# Patient Record
Sex: Female | Born: 1970 | Race: Black or African American | Hispanic: No | Marital: Single | Smoking: Never smoker
Health system: Southern US, Community
[De-identification: ages and names within clinical notes are randomized; demographics above are authoritative.]

## PROBLEM LIST (undated history)

## (undated) ENCOUNTER — Inpatient Hospital Stay (HOSPITAL_COMMUNITY): Payer: Self-pay

## (undated) DIAGNOSIS — Z789 Other specified health status: Secondary | ICD-10-CM

## (undated) DIAGNOSIS — R7303 Prediabetes: Secondary | ICD-10-CM

## (undated) HISTORY — PX: APPENDECTOMY: SHX54

## (undated) HISTORY — PX: ABDOMINAL HYSTERECTOMY: SHX81

## (undated) HISTORY — PX: NO PAST SURGERIES: SHX2092

---

## 2009-02-27 ENCOUNTER — Inpatient Hospital Stay (HOSPITAL_COMMUNITY): Admission: AD | Admit: 2009-02-27 | Discharge: 2009-02-27 | Payer: Self-pay | Admitting: Obstetrics and Gynecology

## 2009-03-12 ENCOUNTER — Inpatient Hospital Stay (HOSPITAL_COMMUNITY): Admission: AD | Admit: 2009-03-12 | Discharge: 2009-03-12 | Payer: Self-pay | Admitting: Obstetrics & Gynecology

## 2009-03-18 ENCOUNTER — Encounter: Payer: Self-pay | Admitting: Obstetrics & Gynecology

## 2009-03-18 ENCOUNTER — Ambulatory Visit: Payer: Self-pay | Admitting: Obstetrics and Gynecology

## 2009-03-18 LAB — CONVERTED CEMR LAB
Basophils Relative: 0 % (ref 0–1)
Eosinophils Absolute: 0.4 10*3/uL (ref 0.0–0.7)
Eosinophils Relative: 6 % — ABNORMAL HIGH (ref 0–5)
HCT: 39.5 % (ref 36.0–46.0)
Hemoglobin: 14 g/dL (ref 12.0–15.0)
Hgb A: 97.3 % (ref 96.8–97.8)
Hgb F Quant: 0 % (ref 0.0–2.0)
Lymphocytes Relative: 31 % (ref 12–46)
Lymphs Abs: 1.8 10*3/uL (ref 0.7–4.0)
MCV: 91.2 fL (ref 78.0–100.0)
Monocytes Absolute: 0.4 10*3/uL (ref 0.1–1.0)
Neutro Abs: 3.3 10*3/uL (ref 1.7–7.7)
Platelets: 277 10*3/uL (ref 150–400)
RBC: 4.33 M/uL (ref 3.87–5.11)
RDW: 13.2 % (ref 11.5–15.5)

## 2009-03-19 ENCOUNTER — Ambulatory Visit (HOSPITAL_COMMUNITY): Admission: RE | Admit: 2009-03-19 | Discharge: 2009-03-19 | Payer: Self-pay | Admitting: Obstetrics & Gynecology

## 2009-04-17 ENCOUNTER — Ambulatory Visit (HOSPITAL_COMMUNITY): Admission: RE | Admit: 2009-04-17 | Discharge: 2009-04-17 | Payer: Self-pay | Admitting: Obstetrics

## 2009-05-08 ENCOUNTER — Ambulatory Visit (HOSPITAL_COMMUNITY): Admission: RE | Admit: 2009-05-08 | Discharge: 2009-05-08 | Payer: Self-pay | Admitting: Obstetrics

## 2009-08-21 ENCOUNTER — Ambulatory Visit (HOSPITAL_COMMUNITY): Admission: RE | Admit: 2009-08-21 | Discharge: 2009-08-21 | Payer: Self-pay | Admitting: Obstetrics

## 2009-10-02 ENCOUNTER — Inpatient Hospital Stay (HOSPITAL_COMMUNITY): Admission: AD | Admit: 2009-10-02 | Discharge: 2009-10-04 | Payer: Self-pay | Admitting: Obstetrics

## 2010-03-27 LAB — CBC
HCT: 27.7 % — ABNORMAL LOW (ref 36.0–46.0)
Hemoglobin: 11.1 g/dL — ABNORMAL LOW (ref 12.0–15.0)
Hemoglobin: 9.5 g/dL — ABNORMAL LOW (ref 12.0–15.0)
MCH: 29.5 pg (ref 26.0–34.0)
MCH: 30.1 pg (ref 26.0–34.0)
MCHC: 33.9 g/dL (ref 30.0–36.0)
MCV: 86.9 fL (ref 78.0–100.0)
Platelets: 259 10*3/uL (ref 150–400)
WBC: 16.5 10*3/uL — ABNORMAL HIGH (ref 4.0–10.5)
WBC: 6.2 10*3/uL (ref 4.0–10.5)

## 2010-03-27 LAB — RPR: RPR Ser Ql: NONREACTIVE

## 2010-04-03 LAB — URINALYSIS, ROUTINE W REFLEX MICROSCOPIC
Glucose, UA: NEGATIVE mg/dL
Protein, ur: NEGATIVE mg/dL
Urobilinogen, UA: 0.2 mg/dL (ref 0.0–1.0)

## 2010-04-07 LAB — URINALYSIS, ROUTINE W REFLEX MICROSCOPIC
Bilirubin Urine: NEGATIVE
Hgb urine dipstick: NEGATIVE
Ketones, ur: 15 mg/dL — AB
pH: 6.5 (ref 5.0–8.0)

## 2010-06-24 ENCOUNTER — Emergency Department (HOSPITAL_COMMUNITY)
Admission: EM | Admit: 2010-06-24 | Discharge: 2010-06-25 | Disposition: A | Discharge status: Home / Self Care | Payer: Worker's Compensation | Attending: Emergency Medicine | Admitting: Emergency Medicine

## 2010-06-24 DIAGNOSIS — M545 Low back pain, unspecified: Secondary | ICD-10-CM | POA: Insufficient documentation

## 2010-06-24 DIAGNOSIS — S335XXA Sprain of ligaments of lumbar spine, initial encounter: Secondary | ICD-10-CM | POA: Insufficient documentation

## 2010-06-24 DIAGNOSIS — S139XXA Sprain of joints and ligaments of unspecified parts of neck, initial encounter: Secondary | ICD-10-CM | POA: Insufficient documentation

## 2010-06-24 DIAGNOSIS — R51 Headache: Secondary | ICD-10-CM | POA: Insufficient documentation

## 2010-06-24 DIAGNOSIS — M542 Cervicalgia: Secondary | ICD-10-CM | POA: Insufficient documentation

## 2010-06-25 ENCOUNTER — Emergency Department (HOSPITAL_COMMUNITY): Payer: Worker's Compensation

## 2011-02-26 IMAGING — US US OB COMP LESS 14 WK
1 series · 14 of 21 positions shown · non-contrast
Comparison: none

OBSTETRICAL ULTRASOUND:
 This ultrasound exam was performed in the [HOSPITAL] Ultrasound Department.  The OB US report was generated in the AS system, and faxed to the ordering physician.  This report is also available in [HOSPITAL]?s AccessANYware and in [REDACTED] PACS.

[Series 1: us ob comp less 14 wks · 14 of 21 slices shown]
[im 1/21]
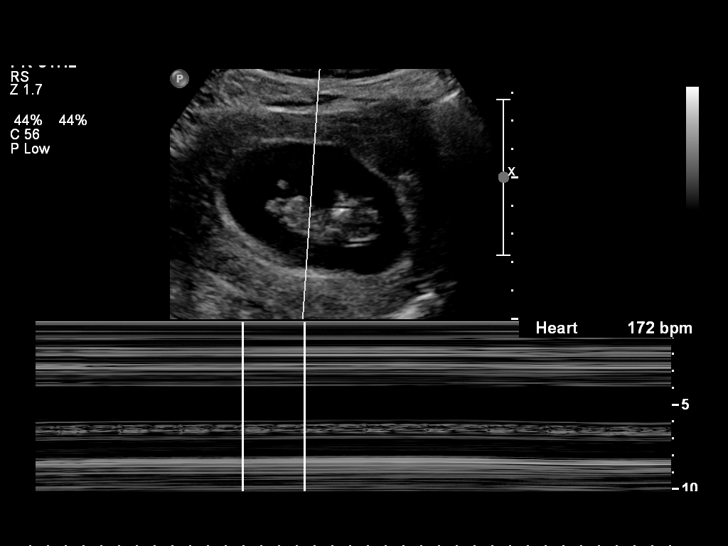
[im 3/21]
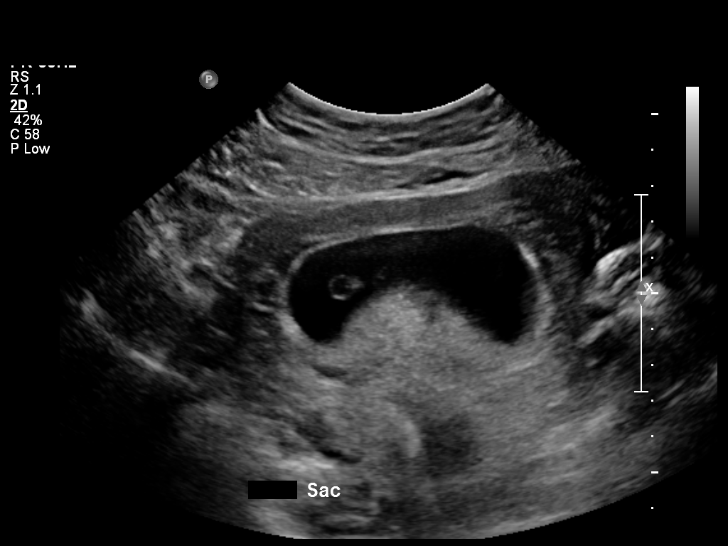
[im 4/21]
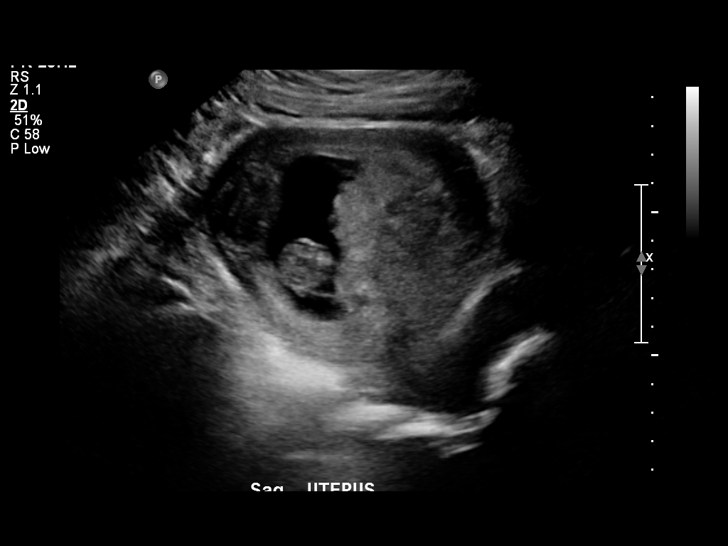
[im 6/21]
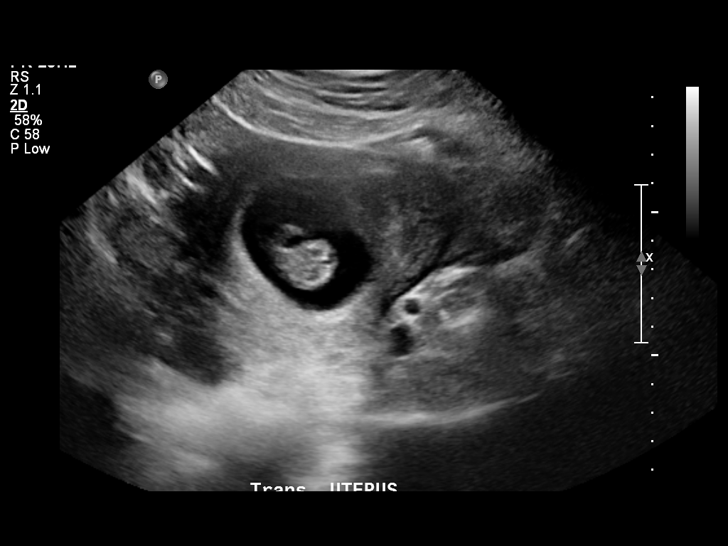
[im 7/21]
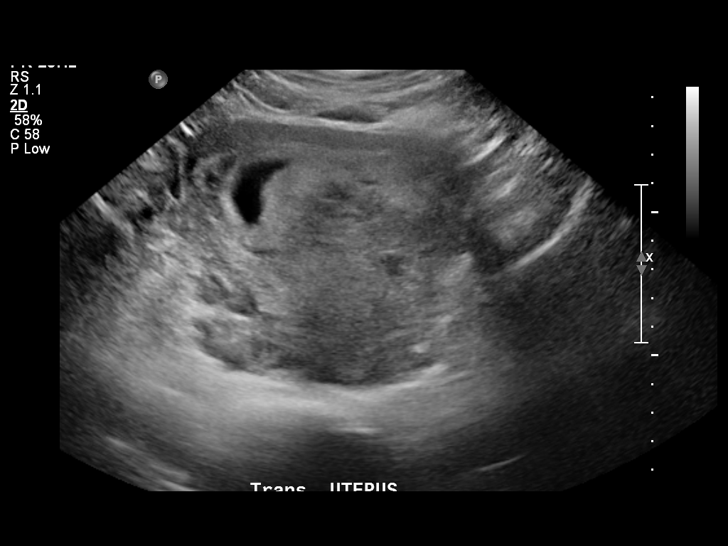
[im 9/21]
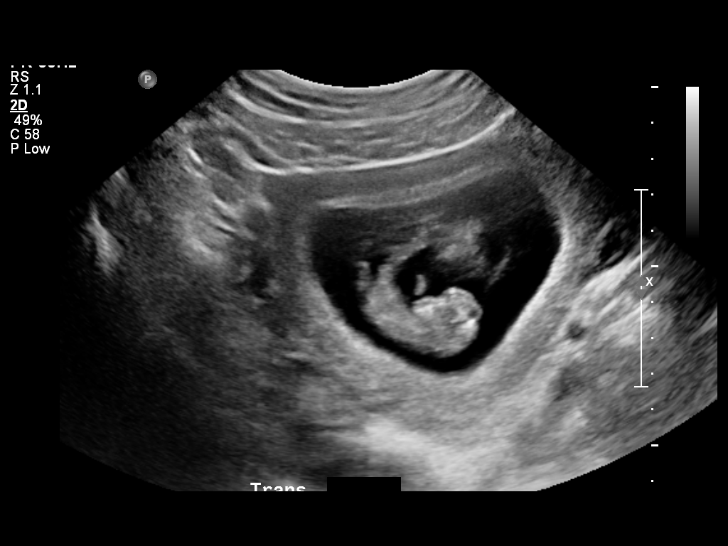
[im 10/21]
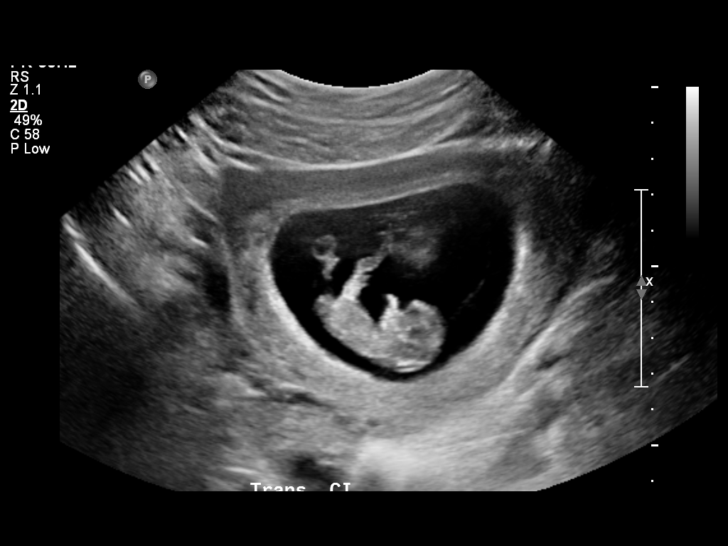
[im 12/21]
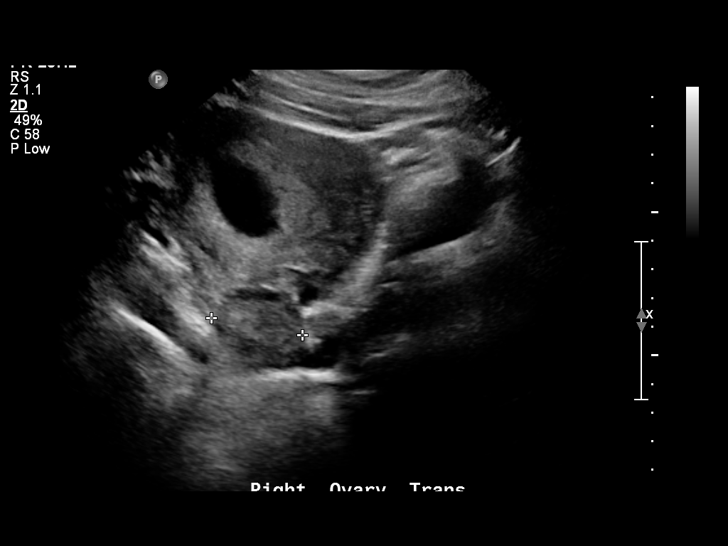
[im 13/21]
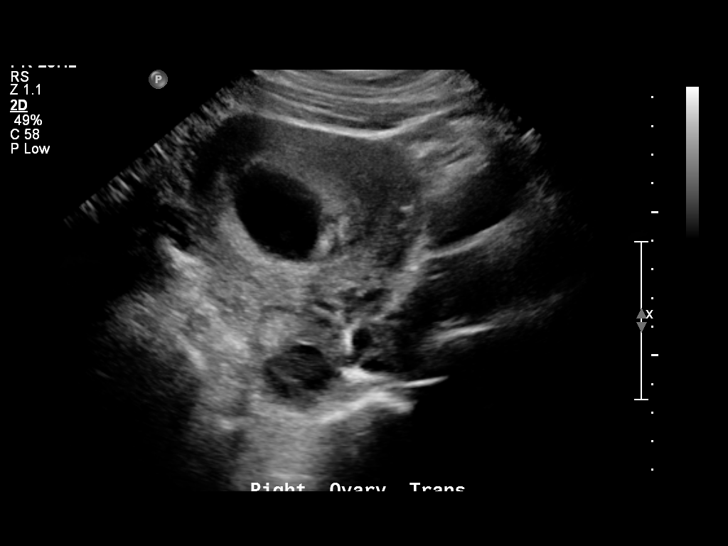
[im 15/21]
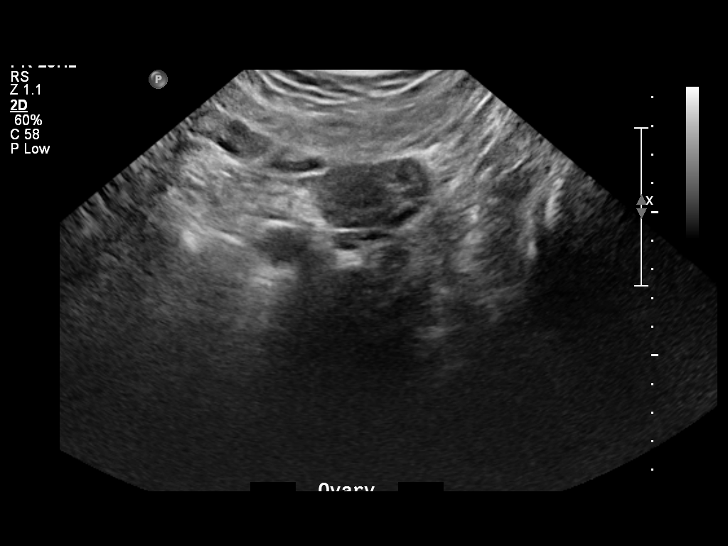
[im 16/21]
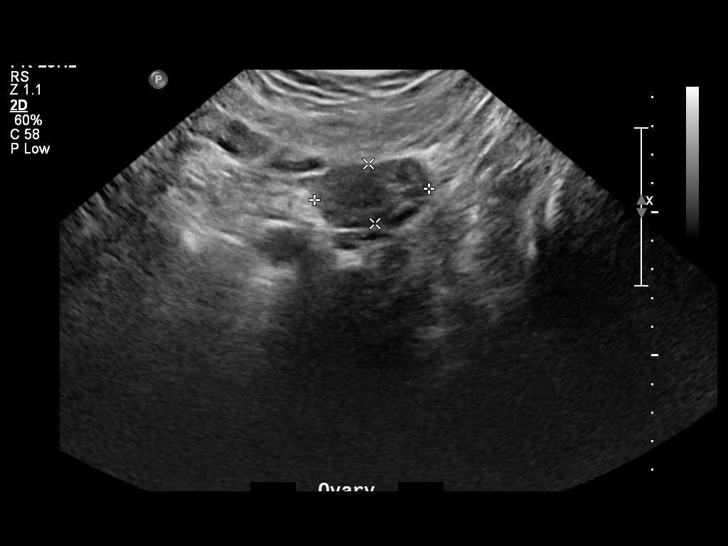
[im 18/21]
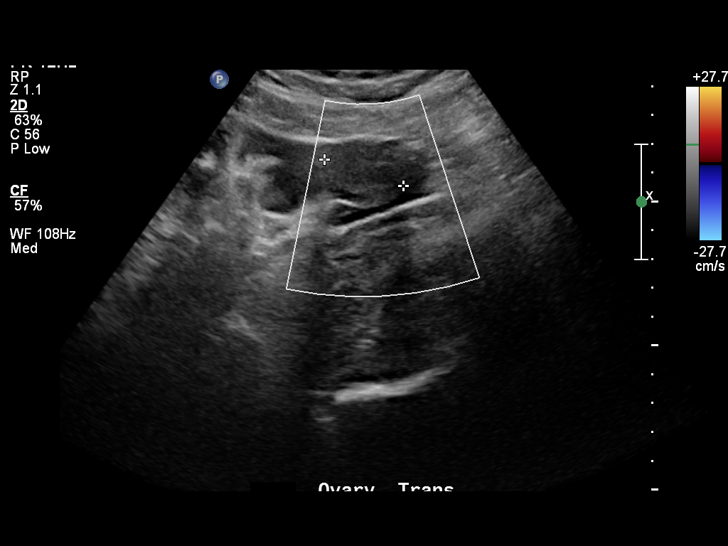
[im 19/21]
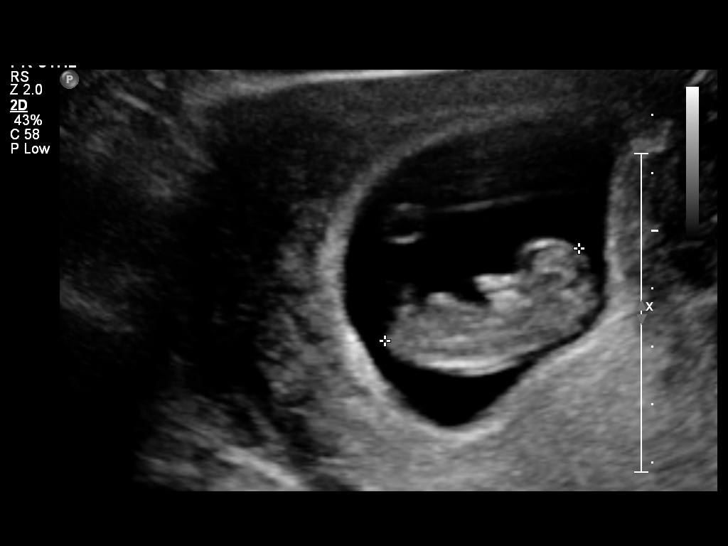
[im 21/21]
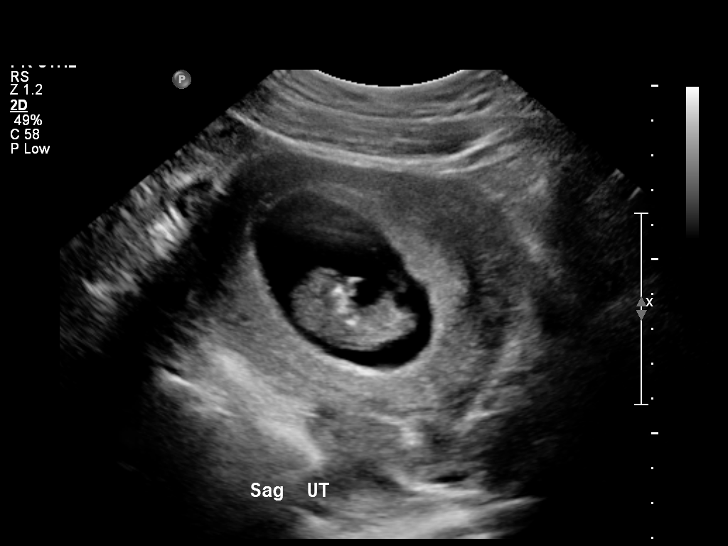

[14 of 21 positions shown; findings below may reference images not displayed]

IMPRESSION: See AS Obstetric US report.

## 2012-11-23 ENCOUNTER — Encounter (HOSPITAL_COMMUNITY): Payer: Self-pay | Admitting: Emergency Medicine

## 2012-11-23 ENCOUNTER — Emergency Department (HOSPITAL_COMMUNITY)
Admission: EM | Admit: 2012-11-23 | Discharge: 2012-11-23 | Discharge status: Against Medical Advice | Payer: Self-pay | Attending: Emergency Medicine | Admitting: Emergency Medicine

## 2012-11-23 DIAGNOSIS — S0990XA Unspecified injury of head, initial encounter: Secondary | ICD-10-CM | POA: Insufficient documentation

## 2012-11-23 DIAGNOSIS — Y92410 Unspecified street and highway as the place of occurrence of the external cause: Secondary | ICD-10-CM | POA: Insufficient documentation

## 2012-11-23 DIAGNOSIS — Y9389 Activity, other specified: Secondary | ICD-10-CM | POA: Insufficient documentation

## 2012-11-23 NOTE — ED Notes (Signed)
Pt states she has decided she would rather go see her PCP.  Pt leaving After Triage.
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
## 2015-04-17 ENCOUNTER — Ambulatory Visit: Payer: Medicaid Other | Admitting: Certified Nurse Midwife

## 2015-08-30 ENCOUNTER — Encounter (HOSPITAL_COMMUNITY): Payer: Self-pay | Admitting: *Deleted

## 2015-08-30 ENCOUNTER — Inpatient Hospital Stay (HOSPITAL_COMMUNITY): Payer: BLUE CROSS/BLUE SHIELD

## 2015-08-30 ENCOUNTER — Inpatient Hospital Stay (HOSPITAL_COMMUNITY)
Admission: AD | Admit: 2015-08-30 | Discharge: 2015-08-30 | Disposition: A | Discharge status: Home / Self Care | Payer: BLUE CROSS/BLUE SHIELD | Source: Ambulatory Visit | Attending: Obstetrics and Gynecology | Admitting: Obstetrics and Gynecology

## 2015-08-30 DIAGNOSIS — Z79899 Other long term (current) drug therapy: Secondary | ICD-10-CM | POA: Insufficient documentation

## 2015-08-30 DIAGNOSIS — O26892 Other specified pregnancy related conditions, second trimester: Secondary | ICD-10-CM | POA: Insufficient documentation

## 2015-08-30 DIAGNOSIS — O9989 Other specified diseases and conditions complicating pregnancy, childbirth and the puerperium: Secondary | ICD-10-CM

## 2015-08-30 DIAGNOSIS — R109 Unspecified abdominal pain: Secondary | ICD-10-CM | POA: Diagnosis not present

## 2015-08-30 DIAGNOSIS — O26891 Other specified pregnancy related conditions, first trimester: Secondary | ICD-10-CM | POA: Insufficient documentation

## 2015-08-30 DIAGNOSIS — O26899 Other specified pregnancy related conditions, unspecified trimester: Secondary | ICD-10-CM

## 2015-08-30 HISTORY — DX: Other specified health status: Z78.9

## 2015-08-30 LAB — CBC
HCT: 30.5 % — ABNORMAL LOW (ref 36.0–46.0)
HEMOGLOBIN: 10.2 g/dL — AB (ref 12.0–15.0)
MCH: 27.6 pg (ref 26.0–34.0)
MCHC: 33.4 g/dL (ref 30.0–36.0)
MCV: 82.7 fL (ref 78.0–100.0)
PLATELETS: 333 10*3/uL (ref 150–400)
RBC: 3.69 MIL/uL — ABNORMAL LOW (ref 3.87–5.11)
RDW: 16.2 % — ABNORMAL HIGH (ref 11.5–15.5)
WBC: 5.3 10*3/uL (ref 4.0–10.5)

## 2015-08-30 LAB — URINALYSIS, ROUTINE W REFLEX MICROSCOPIC
BILIRUBIN URINE: NEGATIVE
GLUCOSE, UA: NEGATIVE mg/dL
Hgb urine dipstick: NEGATIVE
KETONES UR: 15 mg/dL — AB
LEUKOCYTES UA: NEGATIVE
Nitrite: NEGATIVE
PH: 6 (ref 5.0–8.0)
Protein, ur: NEGATIVE mg/dL
Specific Gravity, Urine: 1.02 (ref 1.005–1.030)

## 2015-08-30 LAB — HCG, QUANTITATIVE, PREGNANCY: hCG, Beta Chain, Quant, S: 32 m[IU]/mL — ABNORMAL HIGH (ref <5)

## 2015-08-30 LAB — WET PREP, GENITAL
CLUE CELLS WET PREP: NONE SEEN
SPERM: NONE SEEN
Trich, Wet Prep: NONE SEEN
YEAST WET PREP: NONE SEEN

## 2015-08-30 LAB — ABO/RH: ABO/RH(D): O POS

## 2015-08-30 LAB — POCT PREGNANCY, URINE: Preg Test, Ur: POSITIVE — AB

## 2015-08-30 NOTE — MAU Note (Signed)
Pt states she had her period starting on 08-17-15 x 5 days.  Pt states she had extreme abd pain with cycle "like contractions", not her normal cramping.  Was passing lemon sized clots.  Pt states she still doesn't feel right.  She thinks she may have miscarried.

## 2015-08-30 NOTE — MAU Provider Note (Signed)
History     CSN: [id number]  Arrival date and time: 08/30/15 1605   First Provider Initiated Contact with Patient 08/30/15 1642      Chief Complaint  Patient presents with  . Abdominal Pain   HPI  RN note: Pt states she had her period starting on 08-17-15 x 5 days.  Pt states she had extreme abd pain with cycle "like contractions", not her normal cramping.  Was passing lemon sized clots.  Pt states she still doesn't feel right.  She thinks she may have miscarried.  Pt is 45 y.o.G3P0 789w6d who presents with positive pregnancy test and abdominal cramping and bleeding. Pt saw PCP  With [facility] and had positive UPT.  Pt states she had her period on 08/17/2015 through 08/22/2015 with heavy bleeding and clots along with cramps that felt like a ctx.   Pt is no longer bleeding but thinks she may have had a miscarriage. Pt was not using anything for contraception. Pt denies otherwise vaginal discharge  Past Medical History:  Diagnosis Date  . Medical history non-contributory     Past Surgical History:  Procedure Laterality Date  . NO PAST SURGERIES      History reviewed. No pertinent family history.  Social History  Substance Use Topics  . Smoking status: Never Smoker  . Smokeless tobacco: Never Used  . Alcohol use Yes     Comment: socially    Allergies: No Known Allergies  Prescriptions Prior to Admission  Medication Sig Dispense Refill Last Dose  . Cholecalciferol (VITAMIN D PO) Take 1 tablet by mouth daily.   Past Week at Unknown time  . Cyanocobalamin (VITAMIN B-12 PO) Take 1 tablet by mouth daily.   Past Week at Unknown time  . DiphenhydrAMINE HCl (ALLERGY MED PO) Take 1 tablet by mouth daily as needed.   08/29/2015 at Unknown time  . Multiple Vitamin (MULTIVITAMIN WITH MINERALS) TABS tablet Take 1 tablet by mouth daily.   Past Week at Unknown time  . naproxen sodium (ANAPROX) 220 MG tablet Take 440 mg by mouth daily as needed (pain).   Past Week at Unknown  time  . phentermine 37.5 MG capsule Take 18.75-37.5 mg by mouth daily.   08/30/2015 at Unknown time  . VITAMIN E PO Take 1 capsule by mouth daily.   Past Week at Unknown time    Review of Systems  Constitutional: Negative for chills and fever.  Gastrointestinal: Positive for abdominal pain and diarrhea. Negative for constipation, nausea and vomiting.  Genitourinary: Negative for dysuria.  Neurological: Negative for headaches.   Physical Exam   Blood pressure 141/88, pulse 80, temperature 98.3 F (36.8 C), temperature source Oral, resp. rate 18, height 6' (1.829 m), weight 221 lb (100.2 kg), last menstrual period 08/17/2015, SpO2 100 %.  Physical Exam  Nursing note and vitals reviewed. Constitutional: She is oriented to person, place, and time. She appears well-developed and well-nourished. No distress.  HENT:  Head: Normocephalic.  Eyes: Pupils are equal, round, and reactive to light.  Neck: Normal range of motion. Neck supple.  Cardiovascular: Normal rate.   GI: Soft. She exhibits no distension. There is no tenderness. There is no rebound.  Genitourinary:  Genitourinary Comments: Small amount of white thin discharge in vault; cervix closed, NT; uterus and adnexa without palpable enlargement or tenderness  Musculoskeletal: Normal range of motion.  Neurological: She is alert and oriented to person, place, and time.  Skin: Skin is warm and dry.  Psychiatric: She has  a normal mood and affect.    MAU Course  Procedures Results for orders placed or performed during the hospital encounter of 08/30/15 (from the past 24 hour(s))  Urinalysis, Routine w reflex microscopic (not at [facility])     Status: Abnormal   Collection Time: 08/30/15  4:20 PM  Result Value Ref Range   Color, Urine YELLOW YELLOW   APPearance CLEAR CLEAR   Specific Gravity, Urine 1.020 1.005 - 1.030   pH 6.0 5.0 - 8.0   Glucose, UA NEGATIVE NEGATIVE mg/dL   Hgb urine dipstick NEGATIVE NEGATIVE   Bilirubin Urine  NEGATIVE NEGATIVE   Ketones, ur 15 (A) NEGATIVE mg/dL   Protein, ur NEGATIVE NEGATIVE mg/dL   Nitrite NEGATIVE NEGATIVE   Leukocytes, UA NEGATIVE NEGATIVE  Pregnancy, urine POC     Status: Abnormal   Collection Time: 08/30/15  4:43 PM  Result Value Ref Range   Preg Test, Ur POSITIVE (A) NEGATIVE  hCG, quantitative, pregnancy     Status: Abnormal   Collection Time: 08/30/15  4:55 PM  Result Value Ref Range   hCG, Beta Chain, Quant, S 32 (H) <5 mIU/mL  CBC     Status: Abnormal   Collection Time: 08/30/15  4:55 PM  Result Value Ref Range   WBC 5.3 4.0 - 10.5 K/uL   RBC 3.69 (L) 3.87 - 5.11 MIL/uL   Hemoglobin 10.2 (L) 12.0 - 15.0 g/dL   HCT 16.1 (L) 09.6 - 04.5 %   MCV 82.7 78.0 - 100.0 fL   MCH 27.6 26.0 - 34.0 pg   MCHC 33.4 30.0 - 36.0 g/dL   RDW 40.9 (H) 81.1 - 91.4 %   Platelets 333 150 - 400 K/uL  ABO/Rh     Status: None (Preliminary result)   Collection Time: 08/30/15  4:55 PM  Result Value Ref Range   ABO/RH(D) O POS   Wet prep, genital     Status: Abnormal   Collection Time: 08/30/15  6:05 PM  Result Value Ref Range   Yeast Wet Prep HPF POC NONE SEEN NONE SEEN   Trich, Wet Prep NONE SEEN NONE SEEN   Clue Cells Wet Prep HPF POC NONE SEEN NONE SEEN   WBC, Wet Prep HPF POC FEW (A) NONE SEEN   Sperm NONE SEEN   US Ob Comp Less 14 Wks  Result Date: 08/30/2015 CLINICAL DATA:  Abdominal pain. Positive pregnancy test. Patient states last menstrual period was 08/17/2015. Quantitative beta HCG is 32. EXAM: OBSTETRIC <14 WK [place] AND TRANSVAGINAL OB US TECHNIQUE: Both transabdominal and transvaginal ultrasound examinations were performed for complete evaluation of the gestation as well as the maternal uterus, adnexal regions, and pelvic cul-de-sac. Transvaginal technique was performed to assess early pregnancy. COMPARISON:  None. FINDINGS: Intrauterine gestational sac: None visualized Yolk sac:  None visualized Embryo:  Not visualized Cardiac Activity: Not visualized Subchorionic  hemorrhage:  None visualized. Maternal uterus/adnexae: A posterior fundal fibroid measures 2.7 x 2.3 x 2.5 cm. Uterus and adnexa are otherwise within normal limits. Trace free fluid is present. IMPRESSION: 1. No intrauterine pregnancy identified. 2. Posterior uterine fibroid. Electronically Signed   By: Marin Roberts M.D.   On: 08/30/2015 19:25   US Ob Transvaginal  Result Date: 08/30/2015 CLINICAL DATA:  Abdominal pain. Positive pregnancy test. Patient states last menstrual period was 08/17/2015. Quantitative beta HCG is 32. EXAM: OBSTETRIC <14 WK [place] AND TRANSVAGINAL OB US TECHNIQUE: Both transabdominal and transvaginal ultrasound examinations were performed for complete evaluation of the gestation as  well as the maternal uterus, adnexal regions, and pelvic cul-de-sac. Transvaginal technique was performed to assess early pregnancy. COMPARISON:  None. FINDINGS: Intrauterine gestational sac: None visualized Yolk sac:  None visualized Embryo:  Not visualized Cardiac Activity: Not visualized Subchorionic hemorrhage:  None visualized. Maternal uterus/adnexae: A posterior fundal fibroid measures 2.7 x 2.3 x 2.5 cm. Uterus and adnexa are otherwise within normal limits. Trace free fluid is present. IMPRESSION: 1. No intrauterine pregnancy identified. 2. Posterior uterine fibroid. Electronically Signed   By: Marin Robertshristopher  Mattern M.D.   On: 08/30/2015 19:25  GC/chlamydia pending Discussed low HCG with pt - could be very early pregnancy but with previous +UPT, likely failed pregnancy Pt to return in 48 hours to repeat HCG- sooner if increase in pain or bleeding Assessment and Plan Pregnancy of unknown location- repeat HCG in 2 days- Sunday 8/20   Lindsey Sanchez 08/30/2015, 7:33 PM

## 2015-09-02 LAB — GC/CHLAMYDIA PROBE AMP (~~LOC~~) NOT AT ARMC
CHLAMYDIA, DNA PROBE: NEGATIVE
Neisseria Gonorrhea: NEGATIVE

## 2016-03-19 ENCOUNTER — Encounter: Payer: Self-pay | Admitting: Obstetrics & Gynecology

## 2016-03-19 ENCOUNTER — Other Ambulatory Visit (HOSPITAL_COMMUNITY)
Admission: RE | Admit: 2016-03-19 | Discharge: 2016-03-19 | Disposition: A | Discharge status: Home / Self Care | Payer: BLUE CROSS/BLUE SHIELD | Source: Ambulatory Visit | Attending: Obstetrics & Gynecology | Admitting: Obstetrics & Gynecology

## 2016-03-19 ENCOUNTER — Ambulatory Visit (INDEPENDENT_AMBULATORY_CARE_PROVIDER_SITE_OTHER): Payer: BLUE CROSS/BLUE SHIELD | Admitting: Obstetrics & Gynecology

## 2016-03-19 VITALS — BP 127/77 | HR 88 | Wt 218.0 lb

## 2016-03-19 DIAGNOSIS — N898 Other specified noninflammatory disorders of vagina: Secondary | ICD-10-CM | POA: Diagnosis present

## 2016-03-19 DIAGNOSIS — Z113 Encounter for screening for infections with a predominantly sexual mode of transmission: Secondary | ICD-10-CM

## 2016-03-19 MED ORDER — METRONIDAZOLE 500 MG PO TABS: 500.0000 mg | ORAL_TABLET | Freq: Two times a day (BID) | ORAL | 0 refills | Status: DC

## 2016-03-19 NOTE — Progress Notes (Signed)
   GYNECOLOGY OFFICE VISIT NOTE  History:  46 y.o. G3P0 here today for evaluation of abnormal vaginal discharge x 3 weeks.  Discharge is white, causes itching, and is malodorous. She denies any abnormal vaginal bleeding, pelvic pain or other concerns.   Past Medical History:  Diagnosis Date  . Medical history non-contributory     Past Surgical History:  Procedure Laterality Date  . NO PAST SURGERIES      The following portions of the patient's history were reviewed and updated as appropriate: allergies, current medications, past family history, past medical history, past social history, past surgical history and problem list.   Health Maintenance:  Normal pap and negative HRHPV last year.  Normal mammogram in 2015.   Review of Systems:  Pertinent items noted in HPI and remainder of comprehensive ROS otherwise negative.   Objective:  Physical Exam BP 127/77   Pulse 88   Wt 218 lb (98.9 kg)   LMP 02/14/2016 (Approximate)   Breastfeeding? No   BMI 29.57 kg/m  CONSTITUTIONAL: Well-developed, well-nourished female in no acute distress.  HENT:  Normocephalic, atraumatic. External right and left ear normal. Oropharynx is clear and moist EYES: Conjunctivae and EOM are normal. Pupils are equal, round, and reactive to light. No scleral icterus.  NECK: Normal range of motion, supple, no masses SKIN: Skin is warm and dry. No rash noted. Not diaphoretic. No erythema. No pallor. NEUROLOGIC: Alert and oriented to person, place, and time. Normal reflexes, muscle tone coordination. No cranial nerve deficit noted. PSYCHIATRIC: Normal mood and affect. Normal behavior. Normal judgment and thought content. CARDIOVASCULAR: Normal heart rate noted RESPIRATORY: Effort and breath sounds normal, no problems with respiration noted ABDOMEN: Soft, no distention noted.   PELVIC: Normal appearing external genitalia; normal appearing vaginal mucosa and cervix.  Copious, white, thin abnormal discharge noted  with mildly foul odor.   MUSCULOSKELETAL: Normal range of motion. No edema noted.   Assessment & Plan:  1. Vaginal discharge Likely BV, Metronidazole prescribed.  Proper vulvar hygiene emphasized: discussed avoidance of perfumed soaps, detergents, lotions and any type of douches; in addition to wearing cotton underwear and no underwear at night.  Also recommended cleaning front to back, voiding and cleaning up after intercourse.  - Cervicovaginal ancillary only done, will follow up results and manage accordingly. - metroNIDAZOLE (FLAGYL) 500 MG tablet; Take 1 tablet (500 mg total) by mouth 2 (two) times daily.  Dispense: 14 tablet; Refill: 0  Routine preventative health maintenance measures emphasized; declined mammogram scheduling today. Please refer to After Visit Summary for other counseling recommendations.   Return if symptoms worsen or fail to improve.   Total face-to-face time with patient: 15 minutes. Over 50% of encounter was spent on counseling and coordination of care.   Jaynie CollinsUGONNA  Rielly Corlett, MD, FACOG Attending Obstetrician & Gynecologist, [facility], [facility]

## 2016-03-19 NOTE — Patient Instructions (Signed)
Vaginitis Vaginitis is an inflammation of the vagina. It is most often caused by a change in the normal balance of the bacteria and yeast that live in the vagina. This change in balance causes an overgrowth of certain bacteria or yeast, which causes the inflammation. There are different types of vaginitis, but the most common types are:  Bacterial vaginosis.  Yeast infection (candidiasis).  Trichomoniasis vaginitis. This is a sexually transmitted infection (STI).  Viral vaginitis.  Atrophic vaginitis.  Allergic vaginitis. What are the causes? The cause depends on the type of vaginitis. Vaginitis can be caused by:  Bacteria (bacterial vaginosis).  Yeast (yeast infection).  A parasite (trichomoniasis vaginitis)  A virus (viral vaginitis).  Low hormone levels (atrophic vaginitis). Low hormone levels can occur during pregnancy, breastfeeding, or after menopause.  Irritants, such as bubble baths, scented tampons, and feminine sprays (allergic vaginitis). Other factors can change the normal balance of the yeast and bacteria that live in the vagina. These include:  Antibiotic medicines.  Poor hygiene.  Diaphragms, vaginal sponges, spermicides, birth control pills, and intrauterine devices (IUD).  Sexual intercourse.  Infection.  Uncontrolled diabetes.  A weakened immune system. What are the signs or symptoms? Symptoms can vary depending on the cause of the vaginitis. Common symptoms include:  Abnormal vaginal discharge.  The discharge is white, gray, or yellow with bacterial vaginosis.  The discharge is thick, white, and cheesy with a yeast infection.  The discharge is frothy and yellow or greenish with trichomoniasis.  A bad vaginal odor.  The odor is fishy with bacterial vaginosis.  Vaginal itching, pain, or swelling.  Painful intercourse.  Pain or burning when urinating. Sometimes there are no symptoms. How is this treated? Treatment will vary depending on  the type of infection.  Bacterial vaginosis and trichomoniasis are often treated with antibiotic creams or pills.  Yeast infections are often treated with antifungal medicines, such as vaginal creams or suppositories.  Viral vaginitis has no cure, but symptoms can be treated with medicines that relieve discomfort. Your sexual partner should be treated as well.  Atrophic vaginitis may be treated with an estrogen cream, pill, suppository, or vaginal ring. If vaginal dryness occurs, lubricants and moisturizing creams may help. You may be told to avoid scented soaps, sprays, or douches.  Allergic vaginitis treatment involves quitting the use of the product that is causing the problem. Vaginal creams can be used to treat the symptoms. Follow these instructions at home:  Take all medicines as directed by your caregiver.  Keep your genital area clean and dry. Avoid soap and only rinse the area with water.  Avoid douching. It can remove the healthy bacteria in the vagina.  Do not use tampons or have sexual intercourse until your vaginitis has been treated. Use sanitary pads while you have vaginitis.  Wipe from front to back. This avoids the spread of bacteria from the rectum to the vagina.  Let air reach your genital area. ? Wear cotton underwear to decrease moisture buildup.  Avoid wearing underwear while you sleep until your vaginitis is gone.  Avoid tight pants and underwear or nylons without a cotton panel.  Take off wet clothing (especially bathing suits) as soon as possible.  Use mild, non-scented products. Avoid using irritants, such as:  Scented feminine sprays.  Fabric softeners.  Scented detergents.  Scented tampons.  Scented soaps or bubble baths.  Practice safe sex and use condoms. Condoms may prevent the spread of trichomoniasis and viral vaginitis. Contact a health care   provider if:  You have abdominal pain.  You have symptoms that last for more than 2-3  days.  You have a fever and your symptoms suddenly get worse. This information is not intended to replace advice given to you by your health care provider. Make sure you discuss any questions you have with your health care provider. Document Released: 10/26/2006 Document Revised: 11/20/2015 Document Reviewed: 11/20/2015 Elsevier Interactive Patient Education  2017 [employer]vier Inc[employer]  

## 2016-03-20 LAB — CERVICOVAGINAL ANCILLARY ONLY
Bacterial vaginitis: POSITIVE — AB
CANDIDA VAGINITIS: NEGATIVE
CHLAMYDIA, DNA PROBE: NEGATIVE
Neisseria Gonorrhea: NEGATIVE
TRICH (WINDOWPATH): NEGATIVE

## 2017-04-23 ENCOUNTER — Ambulatory Visit: Payer: BLUE CROSS/BLUE SHIELD | Admitting: Advanced Practice Midwife

## 2017-04-27 ENCOUNTER — Encounter: Payer: Self-pay | Admitting: Certified Nurse Midwife

## 2017-04-27 ENCOUNTER — Ambulatory Visit (INDEPENDENT_AMBULATORY_CARE_PROVIDER_SITE_OTHER): Payer: BLUE CROSS/BLUE SHIELD | Admitting: Certified Nurse Midwife

## 2017-04-27 ENCOUNTER — Other Ambulatory Visit (HOSPITAL_COMMUNITY)
Admission: RE | Admit: 2017-04-27 | Discharge: 2017-04-27 | Disposition: A | Discharge status: Home / Self Care | Payer: BLUE CROSS/BLUE SHIELD | Source: Ambulatory Visit | Attending: Certified Nurse Midwife | Admitting: Certified Nurse Midwife

## 2017-04-27 ENCOUNTER — Ambulatory Visit: Payer: BLUE CROSS/BLUE SHIELD | Admitting: Obstetrics & Gynecology

## 2017-04-27 VITALS — BP 133/85 | HR 76 | Ht 72.0 in | Wt 223.0 lb

## 2017-04-27 DIAGNOSIS — Z01419 Encounter for gynecological examination (general) (routine) without abnormal findings: Secondary | ICD-10-CM | POA: Insufficient documentation

## 2017-04-27 DIAGNOSIS — N898 Other specified noninflammatory disorders of vagina: Secondary | ICD-10-CM | POA: Diagnosis present

## 2017-04-27 DIAGNOSIS — B9689 Other specified bacterial agents as the cause of diseases classified elsewhere: Secondary | ICD-10-CM | POA: Diagnosis not present

## 2017-04-27 DIAGNOSIS — N760 Acute vaginitis: Secondary | ICD-10-CM | POA: Insufficient documentation

## 2017-04-27 DIAGNOSIS — Z30015 Encounter for initial prescription of vaginal ring hormonal contraceptive: Secondary | ICD-10-CM

## 2017-04-27 NOTE — Progress Notes (Signed)
Patient presents for her Annual Exam today. Pt states she has noticed vaginal discharge Pt states she has noticed she is unable to hold her urine now. Denies any pelvic pain or dysuria.  Advised her to do pelvic  Strength excersises (Kegels). .  Last pap:N/A LMP:04/14/17 Contraception:None  Mammogram: 2-3 yrs ago per pt  STD Screening: Declines

## 2017-04-28 ENCOUNTER — Encounter: Payer: Self-pay | Admitting: Certified Nurse Midwife

## 2017-04-28 LAB — CERVICOVAGINAL ANCILLARY ONLY
Bacterial vaginitis: POSITIVE — AB
CANDIDA VAGINITIS: NEGATIVE
Chlamydia: NEGATIVE
NEISSERIA GONORRHEA: NEGATIVE
Trichomonas: POSITIVE — AB

## 2017-04-28 MED ORDER — ETONOGESTREL-ETHINYL ESTRADIOL 0.12-0.015 MG/24HR VA RING: VAGINAL_RING | VAGINAL | 4 refills | Status: DC

## 2017-04-28 NOTE — Progress Notes (Signed)
Subjective:        Lindsey Sanchez is a 47 y.o. female here for a routine exam.  Current complaints: periods: regular monthly lasting 4-5 days with heavy bleeding the first two day, reports severe cramping and passing clots.  Uses Pamprin for the pain, that helps with the cramping.  Declines HIV, RPR, Hepatitis testing.  Reports vaginal discharge.    Reviewed all forms of birth control options available including abstinence; fertility period awareness methods; over the counter/barrier methods; hormonal contraceptive medication including pill, patch, ring, injection,contraceptive implant; hormonal and nonhormonal IUDs; permanent sterilization options including vasectomy and the various tubal sterilization modalities. Risks and benefits reviewed.  Questions were answered.  Information was given to patient to review.        Personal health questionnaire:  Is patient Ashkenazi Jewish, have a family history of breast and/or ovarian cancer: no Is there a family history of uterine cancer diagnosed at age < 9350, gastrointestinal cancer, urinary tract cancer, family member who is a Personnel officerLynch syndrome-associated carrier: no Is the patient overweight and hypertensive, family history of diabetes, personal history of gestational diabetes, preeclampsia or PCOS: yes Is patient over 2655, have PCOS,  family history of premature CHD under age 48, diabetes, smoke, have hypertension or peripheral artery disease:  no At any time, has a partner hit, kicked or otherwise hurt or frightened you?: no Over the past 2 weeks, have you felt down, depressed or hopeless?: no Over the past 2 weeks, have you felt little interest or pleasure in doing things?:not asked   Gynecologic History Patient's last menstrual period was 04/14/2017 (approximate). Contraception: none Last Pap: unknown. Results were: normal according to the patient Last mammogram: unknown. Results were: normal according to the patient  Obstetric History OB  History  Gravida Para Term Preterm AB Living  3         2  SAB TAB Ectopic Multiple Live Births               # Outcome Date GA Lbr Len/2nd Weight Sex Delivery Anes PTL Lv  3 Gravida           2 Gravida           1 [place]Gravida             Past Medical History:  Diagnosis Date  . Medical history non-contributory     Past Surgical History:  Procedure Laterality Date  . NO PAST SURGERIES       Current Outpatient Medications:  .  Cholecalciferol (VITAMIN D PO), Take 1 tablet by mouth daily., Disp: , Rfl:  .  Cyanocobalamin (VITAMIN B-12 PO), Take 1 tablet by mouth daily., Disp: , Rfl:  .  DiphenhydrAMINE HCl (ALLERGY MED PO), Take 1 tablet by mouth daily as needed., Disp: , Rfl:  .  Multiple Vitamin (MULTIVITAMIN WITH MINERALS) TABS tablet, Take 1 tablet by mouth daily., Disp: , Rfl:  .  phentermine 37.5 MG capsule, Take 18.75-37.5 mg by mouth daily., Disp: , Rfl:  .  VITAMIN E PO, Take 1 capsule by mouth daily., Disp: , Rfl:  .  etonogestrel-ethinyl estradiol (NUVARING) 0.12-0.015 MG/24HR vaginal ring, Insert vaginally and leave in place for 4 consecutive weeks, then remove and replace., Disp: 3 each, Rfl: 4 .  metroNIDAZOLE (FLAGYL) 500 MG tablet, Take 1 tablet (500 mg total) by mouth 2 (two) times daily. (Patient not taking: Reported on 04/27/2017), Disp: 14 tablet, Rfl: 0 .  naproxen sodium (ANAPROX) 220 MG tablet, Take 440  mg by mouth daily as needed (pain)., Disp: , Rfl:  No Known Allergies  Social History   Tobacco Use  . Smoking status: Never Smoker  . Smokeless tobacco: Never Used  Substance Use Topics  . Alcohol use: Yes    Comment: socially    Family History  Problem Relation Age of Onset  . Diabetes Mother   . Diabetes Father       Review of Systems  Constitutional: negative for fatigue and weight loss Respiratory: negative for cough and wheezing Cardiovascular: negative for chest pain, fatigue and palpitations Gastrointestinal: negative for abdominal pain and  change in bowel habits Musculoskeletal:negative for myalgias Neurological: negative for gait problems and tremors Behavioral/Psych: negative for abusive relationship, depression Endocrine: negative for temperature intolerance    Genitourinary:negative for abnormal menstrual periods, genital lesions, hot flashes, sexual problems and vaginal discharge Integument/breast: negative for breast lump, breast tenderness, nipple discharge and skin lesion(s)    Objective:       BP 133/85   Pulse 76   Ht 6' (1.829 m)   Wt 223 lb (101.2 kg)   LMP 04/14/2017 (Approximate)   BMI 30.24 kg/m  General:   alert  Skin:   no rash or abnormalities  Lungs:   clear to auscultation bilaterally  Heart:   regular rate and rhythm, S1, S2 normal, no murmur, click, rub or gallop  Breasts:   normal without suspicious masses, skin or nipple changes or axillary nodes  Abdomen:  normal findings: no organomegaly, soft, non-tender and no hernia  Pelvis:  External genitalia: normal general appearance Urinary system: urethral meatus normal and bladder without fullness, nontender Vaginal: normal without tenderness, induration or masses Cervix: normal appearance Adnexa: normal bimanual exam Uterus: anteverted and non-tender, normal size   Lab Review Urine pregnancy test Labs reviewed yes Radiologic studies reviewed yes  50% of 30 min visit spent on counseling and coordination of care.   Assessment & Plan    Healthy female exam.    1. Well woman exam    - Cytology - PAP - MM DIGITAL SCREENING BILATERAL; Future  2. Vaginal discharge    - Cervicovaginal ancillary only  3. Encounter for initial prescription of vaginal ring hormonal contraceptive    - etonogestrel-ethinyl estradiol (NUVARING) 0.12-0.015 MG/24HR vaginal ring; Insert vaginally and leave in place for 4 consecutive weeks, then remove and replace.  Dispense: 3 each; Refill: 4   Education reviewed: calcium supplements, depression evaluation,  low fat, low cholesterol diet, safe sex/STD prevention, self breast exams, skin cancer screening and weight bearing exercise. Contraception: NuvaRing vaginal inserts. Follow up in: 3 months.   Meds ordered this encounter  Medications  . etonogestrel-ethinyl estradiol (NUVARING) 0.12-0.015 MG/24HR vaginal ring    Sig: Insert vaginally and leave in place for 4 consecutive weeks, then remove and replace.    Dispense:  3 each    Refill:  4   Orders Placed This Encounter  Procedures  . MM DIGITAL SCREENING BILATERAL    Standing Status:   Future    Standing Expiration Date:   06/28/2018    Order Specific Question:   Reason for Exam (SYMPTOM  OR DIAGNOSIS REQUIRED)    Answer:   annual exam    Order Specific Question:   Is the patient pregnant?    Answer:   No    Order Specific Question:   Preferred imaging location?    Answer:   [facility]   Need to obtain previous records Possible management options include:  Depo Provera injections declined today Follow up in 3 months for contraception.

## 2017-04-29 LAB — CYTOLOGY - PAP
DIAGNOSIS: NEGATIVE
HPV (WINDOPATH): NOT DETECTED

## 2017-05-04 ENCOUNTER — Telehealth: Payer: Self-pay | Admitting: *Deleted

## 2017-05-04 ENCOUNTER — Other Ambulatory Visit: Payer: Self-pay | Admitting: Certified Nurse Midwife

## 2017-05-04 DIAGNOSIS — N760 Acute vaginitis: Principal | ICD-10-CM

## 2017-05-04 DIAGNOSIS — B9689 Other specified bacterial agents as the cause of diseases classified elsewhere: Secondary | ICD-10-CM

## 2017-05-04 DIAGNOSIS — A599 Trichomoniasis, unspecified: Secondary | ICD-10-CM | POA: Insufficient documentation

## 2017-05-04 MED ORDER — SECNIDAZOLE 2 G PO PACK: 1.0000 | PACK | Freq: Once | ORAL | 0 refills | Status: AC

## 2017-05-04 MED ORDER — TINIDAZOLE 500 MG PO TABS: 2.0000 g | ORAL_TABLET | Freq: Every day | ORAL | 0 refills | Status: DC

## 2017-05-04 NOTE — Telephone Encounter (Signed)
Pt called in requesting to speak to provider about her results and medication stated Solosec was too expensive and can she be prescribed something else for her BV, please advise.Marland Kitchen..Marland Kitchen

## 2017-05-05 NOTE — Telephone Encounter (Signed)
Spoke with pt regarding Rx's and options.

## 2017-05-05 NOTE — Telephone Encounter (Signed)
Please call and explain [employer] cash price to her. Thank you Boykin Reaperachelle

## 2017-05-20 ENCOUNTER — Telehealth: Payer: Self-pay

## 2017-05-20 NOTE — Telephone Encounter (Signed)
Returned call, no answer, left vm 

## 2017-08-08 IMAGING — US US OB TRANSVAGINAL
1 series · 15 of 28 positions shown · non-contrast
Comparison: None.

CLINICAL DATA: Abdominal pain. Positive pregnancy test. Patient
states last menstrual period was 08/17/2015. Quantitative beta HCG
is 32.

EXAM:
OBSTETRIC <14 WK US AND TRANSVAGINAL OB US
TECHNIQUE: Both transabdominal and transvaginal ultrasound examinations were
performed for complete evaluation of the gestation as well as the
maternal uterus, adnexal regions, and pelvic cul-de-sac.
Transvaginal technique was performed to assess early pregnancy.

[Series 1: us ob transvaginal · 15 of 36 slices shown]
[im 1/36]
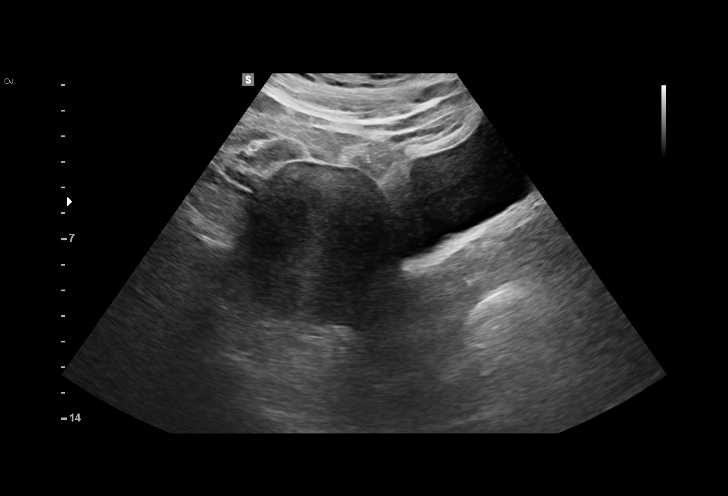
[im 3/36]
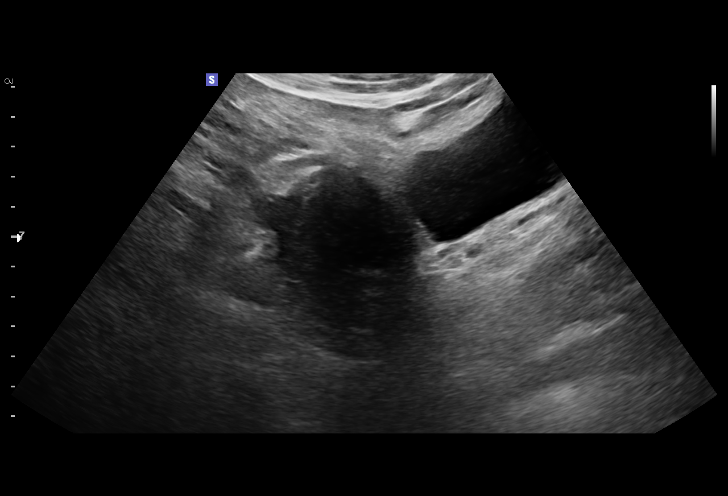
[im 6/36]
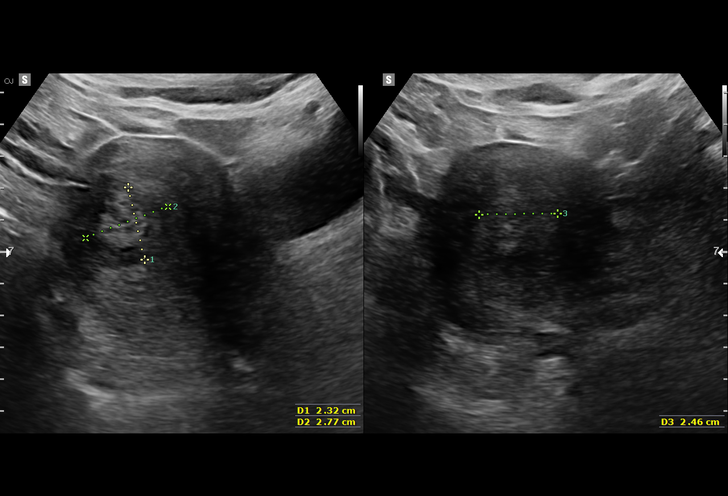
[im 8/36]
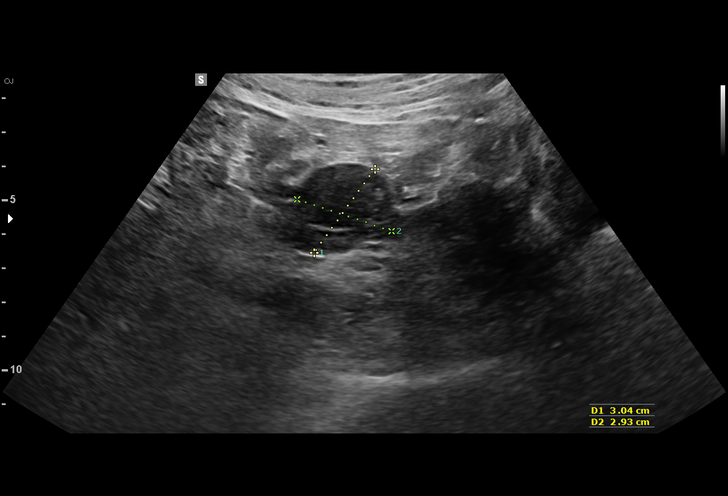
[im 11/36]
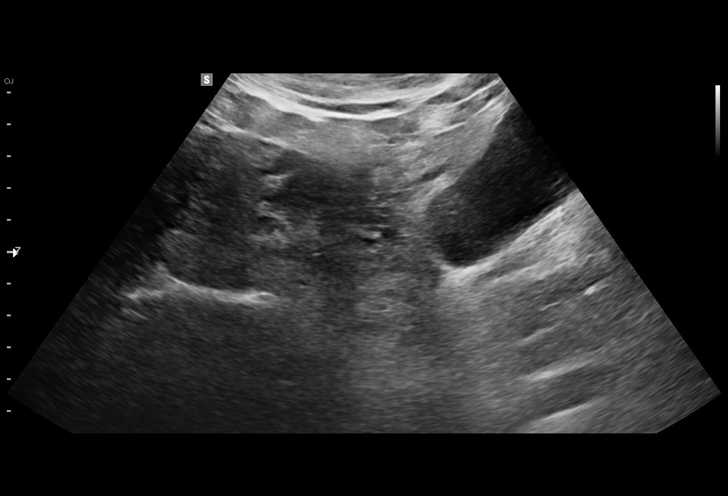
[im 13/36]
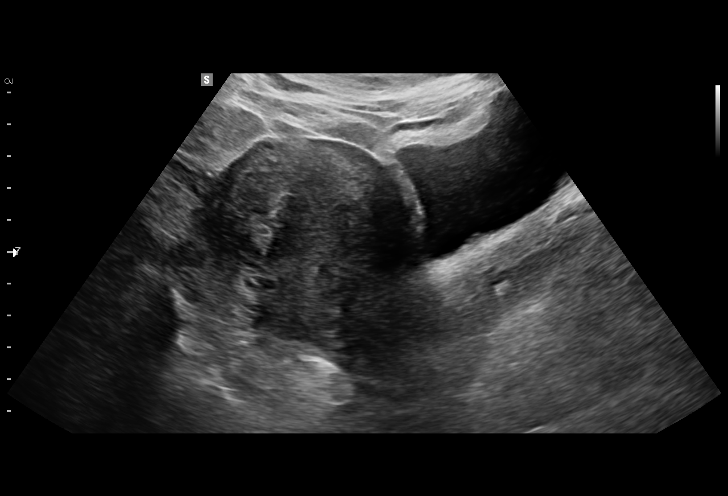
[im 16/36]
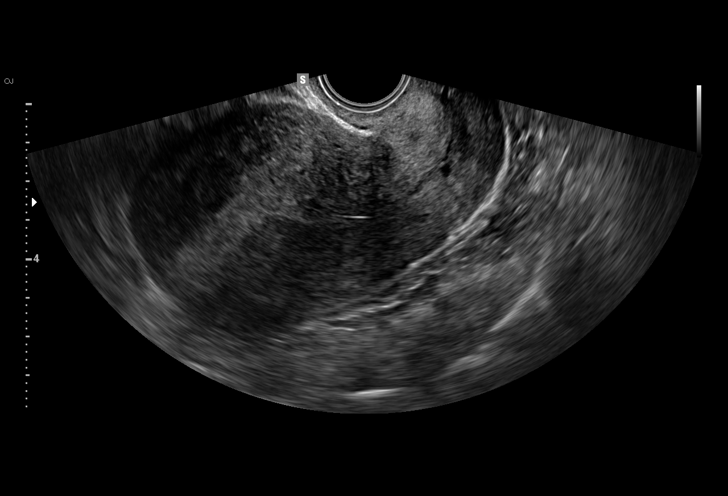
[im 19/36]
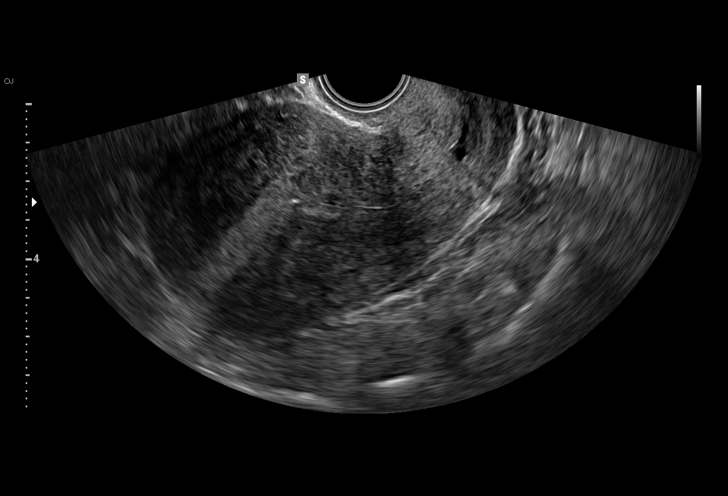
[im 20/36]
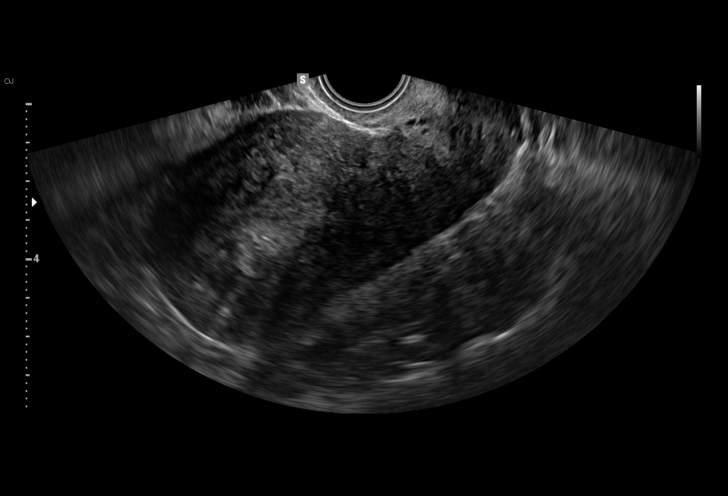
[im 23/36]
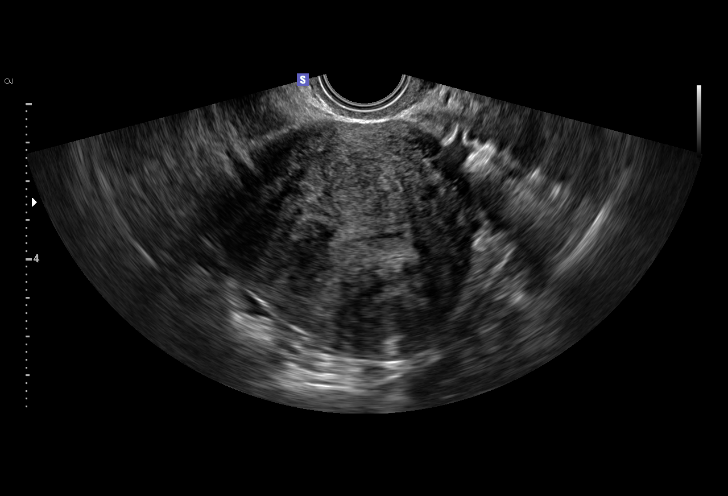
[im 25/36]
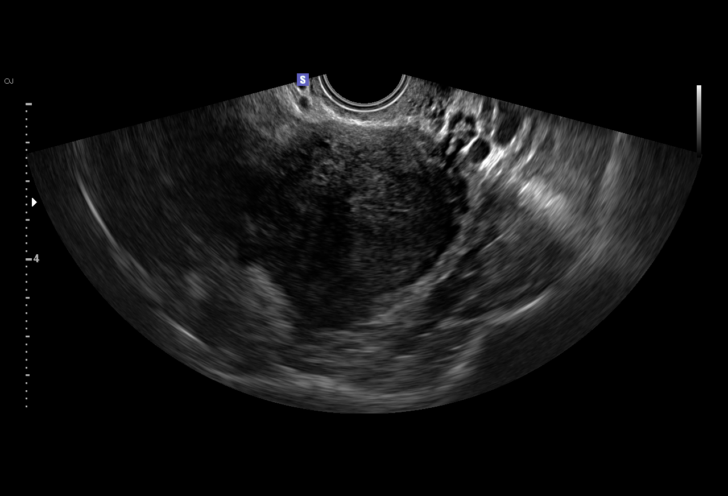
[im 28/36]
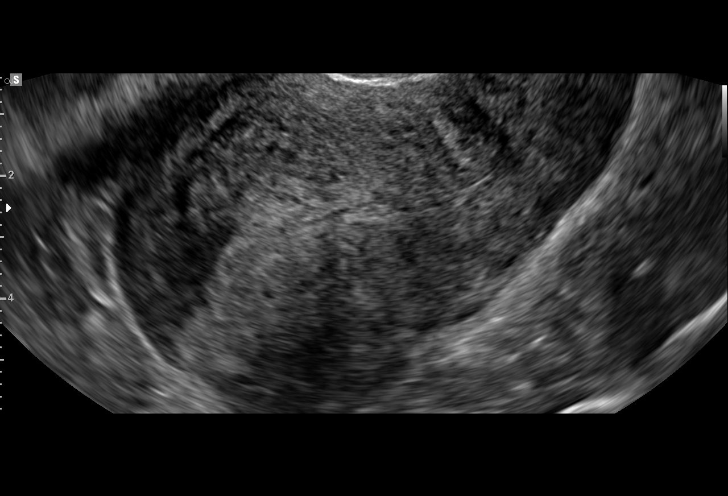
[im 30/36]
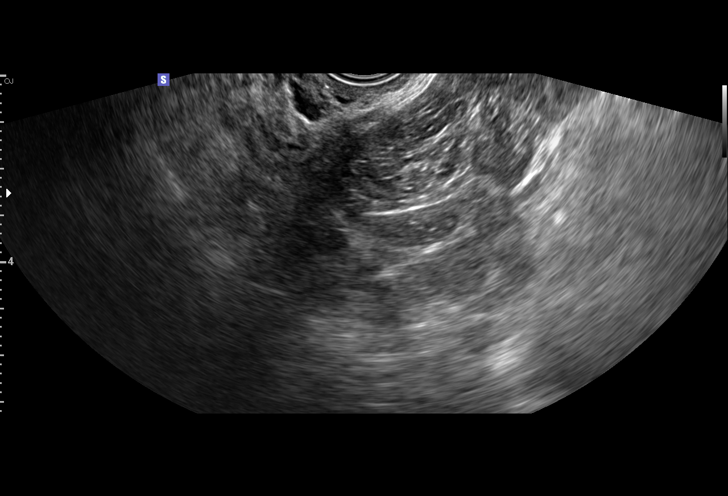
[im 33/36]
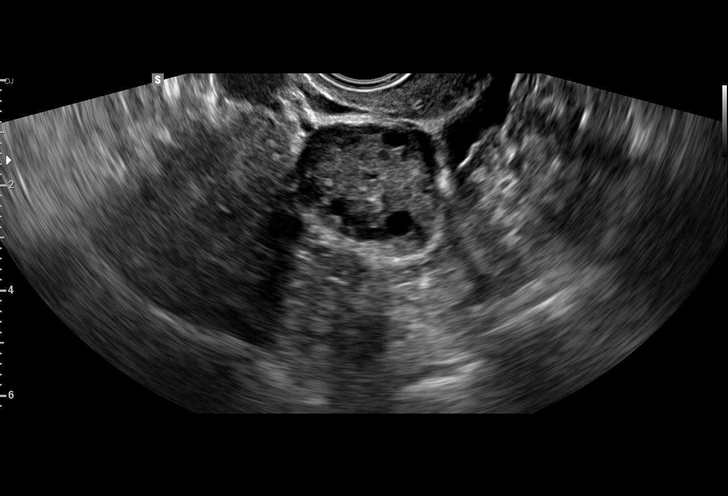
[im 36/36]
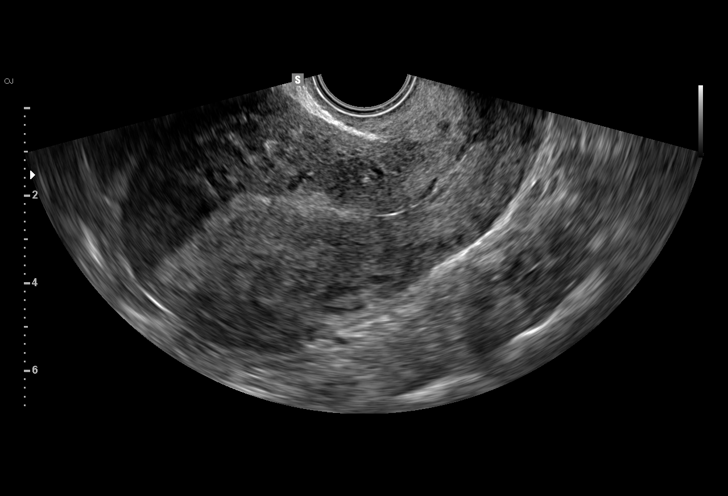

[15 of 28 positions shown; findings below may reference images not displayed]

FINDINGS: Intrauterine gestational sac: None visualized

Yolk sac:  None visualized

Embryo:  Not visualized

Cardiac Activity: Not visualized

Subchorionic hemorrhage:  None visualized.

Maternal uterus/adnexae: A posterior fundal fibroid measures 2.7 x
2.3 x 2.5 cm. Uterus and adnexa are otherwise within normal limits.
Trace free fluid is present.
IMPRESSION: 1. No intrauterine pregnancy identified.
2. Posterior uterine fibroid.

## 2017-09-24 ENCOUNTER — Ambulatory Visit: Payer: Self-pay | Admitting: Advanced Practice Midwife

## 2017-10-13 ENCOUNTER — Encounter: Payer: Self-pay | Admitting: Obstetrics

## 2017-10-13 ENCOUNTER — Ambulatory Visit (INDEPENDENT_AMBULATORY_CARE_PROVIDER_SITE_OTHER): Payer: BLUE CROSS/BLUE SHIELD | Admitting: Obstetrics

## 2017-10-13 ENCOUNTER — Other Ambulatory Visit (HOSPITAL_COMMUNITY)
Admission: RE | Admit: 2017-10-13 | Discharge: 2017-10-13 | Disposition: A | Discharge status: Home / Self Care | Payer: BLUE CROSS/BLUE SHIELD | Source: Ambulatory Visit | Attending: Obstetrics | Admitting: Obstetrics

## 2017-10-13 VITALS — BP 133/84 | HR 89 | Wt 226.0 lb

## 2017-10-13 DIAGNOSIS — N898 Other specified noninflammatory disorders of vagina: Secondary | ICD-10-CM

## 2017-10-13 NOTE — Progress Notes (Signed)
RGYN pt here for problem visit.  Pt c/o vaginal irritation x  A couple of weeks per pt .  Discharge and itching odor . Only wants female providers  Pt was ok doing self swab.  Ok per Dr.Harper    I have reviewed the chart and agree with nursing staff's documentation of this patient's encounter.  Coral Ceo, MD 10/13/2017 4:30 PM

## 2017-10-15 ENCOUNTER — Other Ambulatory Visit: Payer: Self-pay | Admitting: Obstetrics

## 2017-10-15 DIAGNOSIS — A599 Trichomoniasis, unspecified: Secondary | ICD-10-CM

## 2017-10-15 DIAGNOSIS — B373 Candidiasis of vulva and vagina: Secondary | ICD-10-CM

## 2017-10-15 DIAGNOSIS — B3731 Acute candidiasis of vulva and vagina: Secondary | ICD-10-CM

## 2017-10-15 LAB — CERVICOVAGINAL ANCILLARY ONLY
Bacterial vaginitis: POSITIVE — AB
Candida vaginitis: POSITIVE — AB
Chlamydia: NEGATIVE
NEISSERIA GONORRHEA: NEGATIVE
TRICH (WINDOWPATH): NEGATIVE

## 2017-10-15 MED ORDER — FLUCONAZOLE 150 MG PO TABS: 150.0000 mg | ORAL_TABLET | Freq: Once | ORAL | 2 refills | Status: AC

## 2017-10-15 MED ORDER — TINIDAZOLE 500 MG PO TABS: 1000.0000 mg | ORAL_TABLET | Freq: Every day | ORAL | 2 refills | Status: DC

## 2020-01-09 ENCOUNTER — Encounter: Payer: Self-pay | Admitting: Physical Therapy

## 2020-01-09 ENCOUNTER — Other Ambulatory Visit: Payer: Self-pay

## 2020-01-09 ENCOUNTER — Ambulatory Visit: Payer: 59 | Attending: Physician Assistant | Admitting: Physical Therapy

## 2020-01-09 DIAGNOSIS — M25552 Pain in left hip: Secondary | ICD-10-CM | POA: Insufficient documentation

## 2020-01-09 DIAGNOSIS — G8929 Other chronic pain: Secondary | ICD-10-CM | POA: Diagnosis present

## 2020-01-09 DIAGNOSIS — R293 Abnormal posture: Secondary | ICD-10-CM | POA: Diagnosis present

## 2020-01-09 DIAGNOSIS — M545 Low back pain, unspecified: Secondary | ICD-10-CM | POA: Diagnosis present

## 2020-01-09 DIAGNOSIS — M6281 Muscle weakness (generalized): Secondary | ICD-10-CM | POA: Insufficient documentation

## 2020-01-09 NOTE — Therapy (Signed)
[facility] [address] [place], [place], [address] Phone: [phone]   Fax:  [phone]  Physical Therapy Evaluation  Patient Details  Name: Lindsey Sanchez MRN: [id number] Date of Birth: [birth year] Referring Provider (PT): Rainey Pines, [place]   Encounter Date: 01/09/2020   PT End of Session - 01/09/20 1719    Visit Number 1    Number of Visits 13    Date for PT Re-Evaluation 03/05/20    Authorization Type [employer]    PT Start Time 1633    PT Stop Time 1715    PT Time Calculation (min) 42 min    Activity Tolerance Patient tolerated treatment well    Behavior During Therapy [facility] for tasks assessed/performed           Past Medical History:  Diagnosis Date   Medical history non-contributory     Past Surgical History:  Procedure Laterality Date   NO PAST SURGERIES      There were no vitals filed for this visit.    Subjective Assessment - 01/09/20 1637    Subjective pt is a 49 y.o F with CC of L hip pain that started 3-4 months ago, with no specific Onest. . pain starts in the outside of the hip and radiates the back. she denies any red flags. Since onset she reports the symtpoms seem to stay the same. no hx of hip pain but reports hx of scoliosis.    How long can you sit comfortably? 30 min    How long can you stand comfortably? 1 hours    How long can you walk comfortably? 1-2 hours    Diagnostic tests x-ray o fthe hip at the MD's office.    Patient Stated Goals to feel better,    Currently in Pain? Yes    Pain Score 6    at worst 9/10   Pain Location Hip    Pain Orientation Right    Pain Descriptors / Indicators Sharp;Aching    Pain Type Chronic pain    Pain Onset More than a month ago    Pain Frequency Constant    Aggravating Factors  unsure what causes the pain, increases more at night    Pain Relieving Factors medication, heat, icy hot.              [facility] PT Assessment - 01/09/20 0001       Assessment   Medical Diagnosis Lumbago and ITB syndrome    Referring Provider (PT) Rainey Pines, PA-C    Onset Date/Surgical Date --   3-4 months   Hand Dominance Right    Next MD Visit 01/20/2020    Prior Therapy no      Precautions   Precautions None      Restrictions   Weight Bearing Restrictions No      Balance Screen   Has the patient fallen in the past 6 months No    Has the patient had a decrease in activity level because of a fear of falling?  No    Is the patient reluctant to leave their home because of a fear of falling?  No      Home Environment   Living Environment Private residence    Living Arrangements Children    Type of Home House    Home Access Level entry    Home Layout One level    Home Equipment Walker - 2 wheels      Prior  Function   Level of Independence Independent with basic ADLs    Vocation Full time employment   substitute teacher   Vocation Requirements standing/ walking for long period      Cognition   Overall Cognitive Status Within Functional Limits for tasks assessed      Posture/Postural Control   Posture/Postural Control Postural limitations      ROM / Strength   AROM / PROM / Strength AROM;Strength      AROM   AROM Assessment Site Hip;Knee    Right/Left Hip Right;Left    Right Hip Flexion 93    Right Hip ABduction 45    Left Hip Flexion 62    Left Hip ABduction 30    Right/Left Knee Right;Left      Strength   Strength Assessment Site Hip;Knee    Right/Left Hip Right;Left    Right Hip Flexion 4-/5    Right Hip ABduction 4/5    Left Hip Flexion 4-/5    Left Hip ABduction 3+/5    Right/Left Knee Right;Left    Right Knee Flexion 4-/5    Right Knee Extension 4-/5    Left Knee Flexion 4-/5    Left Knee Extension 4-/5      Palpation   Palpation comment TTP located along glute med/ min with mulitple trigger points noted, over the greater trochanter, and the L SIJ      Special Tests    Special Tests Sacrolliac Tests     Sacroiliac Tests  Gaenslen's Test      Sacral thrust    Findings Positive    Side Left      Gaenslen's test   Findings Positive    Side  Left      Ambulation/Gait   Gait Pattern Step-through pattern;Decreased stance time - right;Decreased step length - left                      Objective measurements completed on examination: See above findings.               PT Education - 01/09/20 1718    Education Details evaluation findings, POC, goals, HEP with proper form/ rationale. anatomy of the SIJ And effects of surrounding musculature    Person(s) Educated Patient    Methods Explanation;Verbal cues;Handout    Comprehension Verbalized understanding;Verbal cues required            PT Short Term Goals - 01/09/20 1733      PT SHORT TERM GOAL #1   Title pt to be IND with inital HEP    Baseline no previous HEP    Time 3    Period Weeks    Status New    Target Date 02/06/20      PT SHORT TERM GOAL #2   Title pt to verbalize / demo efficient posture and lifting mechanics to reduce and prevent back and hip pain    Baseline no knowledge of posture    Time 3    Period Weeks    Status New    Target Date 02/06/20             PT Long Term Goals - 01/09/20 1737      PT LONG TERM GOAL #1   Title pt to increa L hip AROM with WFL compared bil with <= 2/10 max pain for functional mobility    Baseline see flowsheet    Time 6    Period Weeks  Status New    Target Date 03/05/20      PT LONG TERM GOAL #2   Title pt to increase L gross hip strength to >/= 4+/5 to promote hip stability with standing and/ walking    Baseline hip abductor strength 3+/5    Time 6    Period Weeks    Status New    Target Date 03/05/20      PT LONG TERM GOAL #3   Title pt to be able to sit, walk and stand for >/= 2 hours with </= 2/10 max pain for function endurance for work requirements    Baseline standing1 hour, sitting 1 hour, walking 1-2 hours max of 7/10    Time  6    Period Weeks    Status New    Target Date 03/05/20      PT LONG TERM GOAL #4   Title pt to be IND with all HEP given and is able to maintain and progress current LOF IND.    Baseline no previous HEP    Time 6    Period Weeks    Status New    Target Date 03/05/20                  Plan - 01/09/20 1720    Clinical Impression Statement pt presents to OPPT with CC of L hip pain starting 3-4 months ago with no specific MOI. She demonstrates limited hip mobility and gross weakness secondary to pain. TTP located along glute med/ min with mulitple trigger points noted, over the greater trochanter, and the L SIJ. She demonstrates an antalgic gait pattern with limited stance on the LLE and short stride with RLE. [facility] L glute med and hip strengthening, She would benefit from physical therapy to decrease L hip pain, increase strength, improve walking/ standing endurance and maximize function by addressing the deficits listed.    Stability/Clinical Decision Making Stable/Uncomplicated    Clinical Decision Making Low    Rehab Potential Good    PT Frequency 2x / week    PT Duration 6 weeks   inital auth 1 x aweek for 3 weeks   PT Treatment/Interventions ADLs/Self Care Home Management;Cryotherapy;Electrical Stimulation;Iontophoresis 4mg /ml Dexamethasone;Moist Heat;Ultrasound;Traction;Gait training;Therapeutic exercise;Therapeutic activities;Neuromuscular re-education;Manual techniques;Passive range of motion;Patient/family education;Taping    PT Next Visit Plan review/ update HEP PRN, Discuss DN for L glute med/ min and IASTM, glute med strengthening, gait training    PT Home Exercise Plan [id number] - LTR, adductor isometrics, glute med strengthening, supine marching,    Consulted and Agree with Plan of Care Patient           Patient will benefit from skilled therapeutic intervention in order to improve the following deficits and impairments:  Abnormal  gait,Improper body mechanics,Increased muscle spasms,Postural dysfunction,Pain,Decreased activity tolerance,Decreased endurance,Decreased range of motion  Visit Diagnosis: Chronic bilateral low back pain, unspecified whether sciatica present  Pain in left hip  Muscle weakness (generalized)  Abnormal posture     Problem List Patient Active Problem List   Diagnosis Date Noted   Trichimoniasis 05/04/2017    Lulu RidingKristoffer Genesis Paget PT, DPT, LAT, ATC  01/09/20  5:47 PM      [facility] [address] [place], [place], [address] Phone: [phone]   Fax:  [phone]  Name: Lindsey Sanchez MRN: [id number] Date of Birth: 09/20/1970      Check all possible CPT codes: 97110- Therapeutic Exercise, [phone]- Neuro Re-education, [phone] -  Gait Training, 12458 - Manual Therapy, [id number] - Therapeutic Activities, [id number] - Self Care, [phone] - Iontophoresis and [id number] - Ultrasound

## 2020-01-31 ENCOUNTER — Ambulatory Visit: Payer: Medicaid Other | Admitting: Physical Therapy

## 2020-06-07 NOTE — Progress Notes (Addendum)
PCP - Dr. Amada Kingfisher Bonsu Cardiologist - no  PPM/ICD -  Device Orders -  Rep Notified -   Chest x-ray -  EKG -  Stress Test -  ECHO -  Cardiac Cath -   Sleep Study -  CPAP -   Fasting Blood Sugar -  Checks Blood Sugar _____ times a day  Blood Thinner Instructions: Aspirin Instructions:  ERAS Protcol - PRE-SURGERY Ensure or G2-   COVID TEST- Ambulatory surgery  Pt. Had allergy symptoms runny nose sent for COVID test 06-21-20  Activity--Able to walk a flight of stairs without SOB  Anesthesia review:   Patient denies shortness of breath, fever, cough and chest pain at PAT appointment   All instructions explained to the patient, with a verbal understanding of the material. Patient agrees to go over the instructions while at home for a better understanding. Patient also instructed to self quarantine after being tested for COVID-19. The opportunity to ask questions was provided.

## 2020-06-07 NOTE — Patient Instructions (Addendum)
DUE TO COVID-19 ONLY ONE VISITOR IS ALLOWED TO COME WITH YOU AND STAY IN THE WAITING ROOM ONLY DURING PRE OP AND PROCEDURE DAY OF SURGERY.   TWO VISITOR  MAY VISIT WITH YOU AFTER SURGERY IN YOUR PRIVATE ROOM DURING VISITING HOURS ONLY!  YOU NEED TO HAVE A COVID 19 TEST ON_______ @_______ , THIS TEST MUST BE DONE BEFORE SURGERY,  COVID TESTING SITE [address] [place] , IT IS ON THE RIGHT GOING OUT [address] APPROXIMATELY  2 MINUTES PAST [employer] ON THE RIGHT. ONCE YOUR COVID TEST IS COMPLETED,  PLEASE BEGIN THE QUARANTINE INSTRUCTIONS AS OUTLINED IN YOUR HANDOUT.                                                                                              Ambulatory surgery no covid test needed               Lindsey Sanchez  06/07/2020   Your procedure is scheduled on: 06-25-20   Report to [facility] Main  Entrance   Report to short stay  at     0515 AM     Call this number if you have problems the morning of surgery [phone]    Remember: NO SOLID FOOD AFTER MIDNIGHT THE NIGHT PRIOR TO SURGERY. NOTHING BY MOUTH EXCEPT CLEAR LIQUIDS UNTIL 0430  am      .   PLEASE FINISH g2  DRINK PER SURGEON ORDER  WHICH NEEDS TO BE COMPLETED AT     0430   am then nothing by mouth  .     CLEAR LIQUID DIET   Foods Allowed                                                                        Foods Excluded water Black Coffee and tea, regular and decaf                             liquids that you cannot  Plain Jell-O any favor except red or purple                                           see through such as: Fruit ices (not with fruit pulp)                                                   milk, soups, orange juice  Iced Popsicles  All solid food Carbonated beverages, regular and diet                                    Cranberry, grape and apple juices Sports drinks like Gatorade Lightly seasoned  clear broth or consume(fat free) Sugar, honey syrup   _____________________________________________________________________     BRUSH YOUR TEETH MORNING OF SURGERY AND RINSE YOUR MOUTH OUT, NO CHEWING GUM CANDY OR MINTS.     Take these medicines the morning of surgery with A SIP OF WATER: none                                 You may not have any metal on your body including hair pins and              piercings  Do not wear jewelry, make-up, lotions, powders or perfumes, deodorant             Do not wear nail polish on your fingernails.  Do not shave  48 hours prior to surgery.     Do not bring valuables to the hospital. [name] IS NOT             RESPONSIBLE   FOR VALUABLES.  Contacts, dentures or bridgework may not be worn into surgery.      Patients discharged the day of surgery will not be allowed to drive home. IF YOU ARE HAVING SURGERY AND GOING HOME THE SAME DAY, YOU MUST HAVE AN ADULT TO DRIVE YOU HOME AND BE WITH YOU FOR 24 HOURS. YOU MAY GO HOME BY TAXI OR UBER OR ORTHERWISE, BUT AN ADULT MUST ACCOMPANY YOU HOME AND STAY WITH YOU FOR 24 HOURS.  Name and phone number of your driver:  Special Instructions: N/A              Please read over the following fact sheets you were given: _____________________________________________________________________             [facility] - Preparing for Surgery Before surgery, you can play an important role.  Because skin is not sterile, your skin needs to be as free of germs as possible.  You can reduce the number of germs on your skin by washing with CHG (chlorahexidine gluconate) soap before surgery.  CHG is an antiseptic cleaner which kills germs and bonds with the skin to continue killing germs even after washing. Please DO NOT use if you have an allergy to CHG or antibacterial soaps.  If your skin becomes reddened/irritated stop using the CHG and inform your nurse when you arrive at Short Stay. Do not shave (including legs  and underarms) for at least 48 hours prior to the first CHG shower.  You may shave your face/neck. Please follow these instructions carefully:  1.  Shower with CHG Soap the night before surgery and the  morning of Surgery.  2.  If you choose to wash your hair, wash your hair first as usual with your  normal  shampoo.  3.  After you shampoo, rinse your hair and body thoroughly to remove the  shampoo.                           4.  Use CHG as you would any other liquid soap.  You can apply chg directly  to the skin and wash                       Gently with a scrungie or clean washcloth.  5.  Apply the CHG Soap to your body ONLY FROM THE NECK DOWN.   Do not use on face/ open                           Wound or open sores. Avoid contact with eyes, ears mouth and genitals (private parts).                       Wash face,  Genitals (private parts) with your normal soap.             6.  Wash thoroughly, paying special attention to the area where your surgery  will be performed.  7.  Thoroughly rinse your body with warm water from the neck down.  8.  DO NOT shower/wash with your normal soap after using and rinsing off  the CHG Soap.                9.  Pat yourself dry with a clean towel.            10.  Wear clean pajamas.            11.  Place clean sheets on your bed the night of your first shower and do not  sleep with pets. Day of Surgery : Do not apply any lotions/deodorants the morning of surgery.  Please wear clean clothes to the hospital/surgery center.  FAILURE TO FOLLOW THESE INSTRUCTIONS MAY RESULT IN THE CANCELLATION OF YOUR SURGERY PATIENT SIGNATURE_________________________________  NURSE SIGNATURE__________________________________  ________________________________________________________________________   [name]  An incentive spirometer is a tool that can help keep your lungs clear and active. This tool measures how well you are filling your lungs with each breath.  Taking long deep breaths may help reverse or decrease the chance of developing breathing (pulmonary) problems (especially infection) following:  A long period of time when you are unable to move or be active. BEFORE THE PROCEDURE   If the spirometer includes an indicator to show your best effort, your nurse or respiratory therapist will set it to a desired goal.  If possible, sit up straight or lean slightly forward. Try not to slouch.  Hold the incentive spirometer in an upright position. INSTRUCTIONS FOR USE  1. Sit on the edge of your bed if possible, or sit up as far as you can in bed or on a chair. 2. Hold the incentive spirometer in an upright position. 3. Breathe out normally. 4. Place the mouthpiece in your mouth and seal your lips tightly around it. 5. Breathe in slowly and as deeply as possible, raising the piston or the ball toward the top of the column. 6. Hold your breath for 3-5 seconds or for as long as possible. Allow the piston or ball to fall to the bottom of the column. 7. Remove the mouthpiece from your mouth and breathe out normally. 8. Rest for a few seconds and repeat Steps 1 through 7 at least 10 times every 1-2 hours when you are awake. Take your time and take a few normal breaths between deep breaths. 9. The spirometer may include an indicator to show your best effort. Use the indicator as a goal to work toward during each repetition. 10. After each  set of 10 deep breaths, practice coughing to be sure your lungs are clear. If you have an incision (the cut made at the time of surgery), support your incision when coughing by placing a pillow or rolled up towels firmly against it. Once you are able to get out of bed, walk around indoors and cough well. You may stop using the incentive spirometer when instructed by your caregiver.  RISKS AND COMPLICATIONS  Take your time so you do not get dizzy or light-headed.  If you are in pain, you may need to take or ask for pain  medication before doing incentive spirometry. It is harder to take a deep breath if you are having pain. AFTER USE  Rest and breathe slowly and easily.  It can be helpful to keep track of a log of your progress. Your caregiver can provide you with a simple table to help with this. If you are using the spirometer at home, follow these instructions: [place] IF:   You are having difficultly using the spirometer.  You have trouble using the spirometer as often as instructed.  Your pain medication is not giving enough relief while using the spirometer.  You develop fever of 100.5 F (38.1 C) or higher. SEEK IMMEDIATE MEDICAL CARE IF:   You cough up bloody sputum that had not been present before.  You develop fever of 102 F (38.9 C) or greater.  You develop worsening pain at or near the incision site. MAKE SURE YOU:   Understand these instructions.  Will watch your condition.  Will get help right away if you are not doing well or get worse. Document Released: 05/11/2006 Document Revised: 03/23/2011 Document Reviewed: 07/12/2006 [facility] Patient Information 2014 [place], [place].   ________________________________________________________________________

## 2020-06-17 ENCOUNTER — Other Ambulatory Visit: Payer: Self-pay

## 2020-06-17 ENCOUNTER — Encounter (HOSPITAL_COMMUNITY): Payer: Self-pay

## 2020-06-17 ENCOUNTER — Encounter (HOSPITAL_COMMUNITY)
Admission: RE | Admit: 2020-06-17 | Discharge: 2020-06-17 | Disposition: A | Discharge status: Home / Self Care | Payer: Medicaid Other | Source: Ambulatory Visit | Attending: Orthopedic Surgery | Admitting: Orthopedic Surgery

## 2020-06-17 DIAGNOSIS — Z01812 Encounter for preprocedural laboratory examination: Secondary | ICD-10-CM | POA: Insufficient documentation

## 2020-06-17 HISTORY — DX: Prediabetes: R73.03

## 2020-06-17 LAB — BASIC METABOLIC PANEL
Anion gap: 7 (ref 5–15)
BUN: 7 mg/dL (ref 6–20)
CO2: 26 mmol/L (ref 22–32)
Calcium: 9.6 mg/dL (ref 8.9–10.3)
Chloride: 103 mmol/L (ref 98–111)
Creatinine, Ser: 0.76 mg/dL (ref 0.44–1.00)
GFR, Estimated: >60 mL/min (ref >60)
Glucose, Bld: 101 mg/dL — ABNORMAL HIGH (ref 70–99)
Potassium: 3.9 mmol/L (ref 3.5–5.1)
Sodium: 136 mmol/L (ref 135–145)

## 2020-06-17 LAB — CBC
HCT: 41.4 % (ref 36.0–46.0)
Hemoglobin: 13.7 g/dL (ref 12.0–15.0)
MCH: 31 pg (ref 26.0–34.0)
MCHC: 33.1 g/dL (ref 30.0–36.0)
MCV: 93.7 fL (ref 80.0–100.0)
Platelets: 289 10*3/uL (ref 150–400)
RBC: 4.42 MIL/uL (ref 3.87–5.11)
RDW: 13.7 % (ref 11.5–15.5)
WBC: 4.6 10*3/uL (ref 4.0–10.5)
nRBC: 0 % (ref 0.0–0.2)

## 2020-06-17 LAB — SURGICAL PCR SCREEN
MRSA, PCR: NEGATIVE
Staphylococcus aureus: NEGATIVE

## 2020-06-17 NOTE — Progress Notes (Signed)
LVM for Tresa Endo with Dr. Eulah Pont. Pt. . Here for preop stating surgery is for Left hip pt. Surgery booked for Left knee. Aslo sent message to Levester Fresh PA via epic.

## 2020-06-18 LAB — HEMOGLOBIN A1C
Hgb A1c MFr Bld: 6.2 % — ABNORMAL HIGH (ref 4.8–5.6)
Mean Plasma Glucose: 131 mg/dL

## 2020-06-20 NOTE — H&P (Signed)
HIP ARTHROPLASTY ADMISSION H&P  Patient ID: Lindsey Sanchez MRN: [id number] DOB/AGE: 1970-05-10 50 y.o.  Chief Complaint: left hip pain.  Planned Procedure Date: 06/25/20 Medical Clearance by Dr. Greggory Stallion Osei-Bonsu   HPI: Lindsey Sanchez is a 50 y.o. 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 The patient has a history of pain and functional disability in the left hip due to arthritis and has failed non-surgical conservative treatments for greater than 12 weeks to include NSAID's and/or analgesics, corticosteriod injections, and activity modification.  Onset of symptoms was gradual, starting 2 years ago with gradually worsening course since that time. The patient noted no past surgery on the left hip.  Patient currently rates pain at 10 out of 10 with activity. Patient has night pain, worsening of pain with activity and weight bearing, and pain that interferes with activities of daily living.  Patient has evidence of subchondral sclerosis and periarticular osteophytes by imaging studies.  There is no active infection.  Past Medical History:  Diagnosis Date   Medical history non-contributory    Pre-diabetes    Past Surgical History:  Procedure Laterality Date   ABDOMINAL HYSTERECTOMY     APPENDECTOMY     No Known Allergies Prior to Admission medications   Medication Sig Start Date End Date Taking? Authorizing Provider  acetaminophen (TYLENOL) 500 MG tablet Take 1,000 mg by mouth every 8 (eight) hours as needed for moderate pain.   Yes [provider]  fluticasone (FLONASE) 50 MCG/ACT nasal spray Place 1 spray into both nostrils daily as needed for allergies or rhinitis.   Yes [provider]  ibuprofen (ADVIL) 200 MG tablet Take 800 mg by mouth every 8 (eight) hours as needed for moderate pain.   Yes [provider]  levocetirizine (XYZAL) 5 MG tablet Take 5 mg by mouth every evening.   Yes [provider]  magnesium oxide (MAG-OX) 400 MG tablet  Take 400 mg by mouth 2 (two) times a week.   Yes [provider]  Menthol, Topical Analgesic, (BENGAY VANISHING SCENT) 2.5 % GEL Apply 1 application topically daily as needed (pain).   Yes [provider]  montelukast (SINGULAIR) 10 MG tablet Take 10 mg by mouth at bedtime.   Yes [provider]  Multiple Vitamin (MULTIVITAMIN WITH MINERALS) TABS tablet Take 1 tablet by mouth daily.   Yes [provider]   Social History   Socioeconomic History   Marital status: Single    Spouse name: Not on file   Number of children: Not on file   Years of education: Not on file   Highest education level: Not on file  Occupational History   Not on file  Tobacco Use   Smoking status: Never   Smokeless tobacco: Never  Vaping Use   Vaping Use: Never used  Substance and Sexual Activity   Alcohol use: Yes    Comment: socially   Drug use: No   Sexual activity: Yes    Birth control/protection: None  Other Topics Concern   Not on file  Social History Narrative   Not on file   Social Determinants of Health   Financial Resource Strain: Not on file  Food Insecurity: Not on file  Transportation Needs: Not on file  Physical Activity: Not on file  Stress: Not on file  Social Connections: Not on file   Family History  Problem Relation Age of Onset   Diabetes Mother    Diabetes Father     ROS: Currently  denies lightheadedness, dizziness, Fever, chills, CP, SOB.   No personal history of DVT, PE, MI, or CVA. No loose teeth or dentures. All other systems have been reviewed and were otherwise currently negative with the exception of those mentioned in the HPI and as above.  Objective: Vitals: Ht: 6' Wt: 235.6 lbs Temp: 98.7 BP: 132/83 Pulse: 85 O2 96% on room air.   Physical Exam: General: Alert, NAD. Trendelenberg Gait  HEENT: EOMI, Good Neck Extension. Head is normocephalic, atraumatic. No pharyngeal erythema present Pulm: No increased work of breathing.   Clear B/L A/P w/o crackle or wheeze.  CV: RRR, No m/g/r appreciated  GI: soft, NT, ND. BS x 4 quadrants Neuro: CN II-XII grossly intact without focal deficit.  Sensation intact distally Skin: No lesions in the area of chief complaint MSK/Surgical Site: L hip pain with ROM.  ROM is greatly limited.  Decreased strength on the left side. The patient walks with a limp. - SLR, + Stinchfield, + FABER, + FADIR.  TTP along the lateral aspect of the left hip joint.  NVI.    Imaging Review Plain radiographs demonstrate moderate degenerative joint disease of the left hip.   The bone quality appears to be fair for age and reported activity level.  Preoperative templating of the joint replacement has been completed, documented, and submitted to the Operating Room personnel in order to optimize intra-operative equipment management.  Assessment: OA LEFT HIP Active Problems:   * No active hospital problems. *   Plan: Plan for Procedure(s): LEFT TOTAL HIP ARTHROPLASTY  The patient history, physical exam, clinical judgement of the provider and imaging are consistent with end stage degenerative joint disease and total joint arthroplasty is deemed medically necessary. The treatment options including medical management, injection therapy, and arthroplasty were discussed at length. The risks and benefits of Procedure(s): LEFT TOTAL HIP ARTHROPLASTY were presented and reviewed.  The risks of nonoperative treatment, versus surgical intervention including but not limited to continued pain, aseptic loosening, stiffness, dislocation/subluxation, infection, bleeding, nerve injury, blood clots, cardiopulmonary complications, morbidity, mortality, among others were discussed. The patient verbalizes understanding and wishes to proceed with the plan.  Patient is being admitted for surgery, pain control, PT, prophylactic antibiotics, VTE prophylaxis, progressive ambulation, ADL's and discharge planning.   Dental  prophylaxis discussed and recommended for 2 years postoperatively.  The patient does meet the criteria for TXA which will be used perioperatively.   ASA 81 mg BID will be used postoperatively for DVT prophylaxis in addition to SCDs, and early ambulation. Plan for Oxycodone, Mobic, Tylenol for pain.   Robaxin for muscle spasms.  Zofran for nausea and vomiting. Omeprazole for gastric protection. Pharmacy- [facility] on [address] The patient is planning to be discharged home with OPPT at [facility] and into the care of her daughter Lindsey Sanchez who can be reached at [phone] Follow up appt scheduled for 07/10/20 at 4:15pm     Marzetta Board Office [phone] 06/20/2020 6:09 PM

## 2020-06-21 ENCOUNTER — Other Ambulatory Visit (HOSPITAL_COMMUNITY)
Admission: RE | Admit: 2020-06-21 | Discharge: 2020-06-21 | Disposition: A | Discharge status: Home / Self Care | Payer: Medicaid Other | Source: Ambulatory Visit | Attending: Orthopedic Surgery | Admitting: Orthopedic Surgery

## 2020-06-21 DIAGNOSIS — Z20822 Contact with and (suspected) exposure to covid-19: Secondary | ICD-10-CM | POA: Diagnosis not present

## 2020-06-21 DIAGNOSIS — Z01812 Encounter for preprocedural laboratory examination: Secondary | ICD-10-CM | POA: Insufficient documentation

## 2020-06-21 LAB — SARS CORONAVIRUS 2 (TAT 6-24 HRS): SARS Coronavirus 2: NEGATIVE

## 2020-06-24 MED ORDER — BUPIVACAINE LIPOSOME 1.3 % IJ SUSP
10.0000 mL | Freq: Once | INTRAMUSCULAR | Status: DC
  Filled 2020-06-24: qty 10

## 2020-06-24 NOTE — Anesthesia Preprocedure Evaluation (Addendum)
Anesthesia Evaluation  Patient identified by MRN, date of birth, ID band Patient awake    Reviewed: Allergy & Precautions, NPO status , Patient's Chart, lab work & pertinent test results  Airway Mallampati: II  TM Distance: >3 FB Neck ROM: Full    Dental no notable dental hx. (+) Teeth Intact, Dental Advisory Given   Pulmonary    Pulmonary exam normal breath sounds clear to auscultation       Cardiovascular Exercise Tolerance: Good negative cardio ROS Normal cardiovascular exam Rhythm:Regular Rate:Normal     Neuro/Psych negative neurological ROS  negative psych ROS   GI/Hepatic negative GI ROS, Neg liver ROS,   Endo/Other  negative endocrine ROS  Renal/GU negative Renal ROSLab Results      Component                Value               Date                      CREATININE               0.76                06/17/2020                BUN                      7                   06/17/2020                NA                       136                 06/17/2020                K                        3.9                 06/17/2020                CL                       103                 06/17/2020                CO2                      26                  06/17/2020                Musculoskeletal  (+) Arthritis ,   Abdominal (+) + obese,   Peds  Hematology negative hematology ROS (+) Lab Results      Component                Value               Date                      WBC  4.6                 06/17/2020                HGB                      13.7                06/17/2020                HCT                      41.4                06/17/2020                MCV                      93.7                06/17/2020                PLT                      289                 06/17/2020              Anesthesia Other Findings   Reproductive/Obstetrics                             Anesthesia Physical Anesthesia Plan  ASA: 2  Anesthesia Plan: Spinal   Post-op Pain Management:    Induction: Intravenous  PONV Risk Score and Plan: Treatment may vary due to age or medical condition  Airway Management Planned: Natural Airway and Nasal Cannula  Additional Equipment: None  Intra-op Plan:   Post-operative Plan:   Informed Consent: I have reviewed the patients History and Physical, chart, labs and discussed the procedure including the risks, benefits and alternatives for the proposed anesthesia with the patient or authorized representative who has indicated his/her understanding and acceptance.     Dental advisory given  Plan Discussed with: CRNA and Anesthesiologist  Anesthesia Plan Comments: (Spinal )       Anesthesia Quick Evaluation

## 2020-06-25 ENCOUNTER — Other Ambulatory Visit: Payer: Self-pay

## 2020-06-25 ENCOUNTER — Ambulatory Visit (HOSPITAL_COMMUNITY): Payer: Medicaid Other | Admitting: Anesthesiology

## 2020-06-25 ENCOUNTER — Observation Stay (HOSPITAL_COMMUNITY)
Admission: RE | Admit: 2020-06-25 | Discharge: 2020-06-27 | DRG: 470 | Disposition: A | Discharge status: Home / Self Care | Payer: Medicaid Other | Attending: Orthopedic Surgery | Admitting: Orthopedic Surgery

## 2020-06-25 ENCOUNTER — Observation Stay (HOSPITAL_COMMUNITY): Payer: Medicaid Other

## 2020-06-25 ENCOUNTER — Ambulatory Visit (HOSPITAL_COMMUNITY): Payer: Medicaid Other

## 2020-06-25 ENCOUNTER — Encounter (HOSPITAL_COMMUNITY)
Admission: RE | Disposition: A | Discharge status: Home / Self Care | Payer: Self-pay | Source: Home / Self Care | Attending: Orthopedic Surgery

## 2020-06-25 ENCOUNTER — Encounter (HOSPITAL_COMMUNITY): Payer: Self-pay | Admitting: Orthopedic Surgery

## 2020-06-25 DIAGNOSIS — Z79899 Other long term (current) drug therapy: Secondary | ICD-10-CM

## 2020-06-25 DIAGNOSIS — E669 Obesity, unspecified: Secondary | ICD-10-CM | POA: Diagnosis not present

## 2020-06-25 DIAGNOSIS — Z96649 Presence of unspecified artificial hip joint: Secondary | ICD-10-CM

## 2020-06-25 DIAGNOSIS — M1612 Unilateral primary osteoarthritis, left hip: Secondary | ICD-10-CM | POA: Diagnosis present

## 2020-06-25 DIAGNOSIS — Z6831 Body mass index (BMI) 31.0-31.9, adult: Secondary | ICD-10-CM

## 2020-06-25 DIAGNOSIS — Z96642 Presence of left artificial hip joint: Secondary | ICD-10-CM | POA: Diagnosis present

## 2020-06-25 DIAGNOSIS — Z833 Family history of diabetes mellitus: Secondary | ICD-10-CM | POA: Diagnosis not present

## 2020-06-25 DIAGNOSIS — Z90710 Acquired absence of both cervix and uterus: Secondary | ICD-10-CM

## 2020-06-25 DIAGNOSIS — R7303 Prediabetes: Secondary | ICD-10-CM | POA: Diagnosis present

## 2020-06-25 DIAGNOSIS — K5900 Constipation, unspecified: Secondary | ICD-10-CM | POA: Diagnosis not present

## 2020-06-25 DIAGNOSIS — R112 Nausea with vomiting, unspecified: Secondary | ICD-10-CM | POA: Diagnosis not present

## 2020-06-25 HISTORY — PX: TOTAL HIP ARTHROPLASTY: SHX124

## 2020-06-25 LAB — TYPE AND SCREEN
ABO/RH(D): O POS
Antibody Screen: NEGATIVE

## 2020-06-25 SURGERY — ARTHROPLASTY, HIP, TOTAL, ANTERIOR APPROACH
Anesthesia: Spinal | Site: Hip | Laterality: Left

## 2020-06-25 MED ORDER — ACETAMINOPHEN 325 MG PO TABS: 325.0000 mg | ORAL_TABLET | Freq: Four times a day (QID) | ORAL | Status: DC | PRN

## 2020-06-25 MED ORDER — OXYCODONE HCL 5 MG PO TABS
10.0000 mg | ORAL_TABLET | ORAL | Status: DC | PRN
  Administered 2020-06-25 – 2020-06-26 (×3): 15 mg via ORAL
  Filled 2020-06-25 (×4): qty 3

## 2020-06-25 MED ORDER — PROPOFOL 500 MG/50ML IV EMUL
INTRAVENOUS | Status: DC | PRN
  Administered 2020-06-25: 75 ug/kg/min via INTRAVENOUS

## 2020-06-25 MED ORDER — MIDAZOLAM HCL 2 MG/2ML IJ SOLN
INTRAMUSCULAR | Status: AC
  Filled 2020-06-25: qty 2

## 2020-06-25 MED ORDER — PHENYLEPHRINE 40 MCG/ML (10ML) SYRINGE FOR IV PUSH (FOR BLOOD PRESSURE SUPPORT)
PREFILLED_SYRINGE | INTRAVENOUS | Status: DC | PRN
  Administered 2020-06-25 (×3): 80 ug via INTRAVENOUS
  Administered 2020-06-25: 120 ug via INTRAVENOUS
  Administered 2020-06-25: 80 ug via INTRAVENOUS
  Administered 2020-06-25: 120 ug via INTRAVENOUS
  Administered 2020-06-25: 80 ug via INTRAVENOUS

## 2020-06-25 MED ORDER — PROPOFOL 1000 MG/100ML IV EMUL
INTRAVENOUS | Status: AC
  Filled 2020-06-25: qty 100

## 2020-06-25 MED ORDER — POVIDONE-IODINE 10 % EX SWAB: 2.0000 "application " | Freq: Once | CUTANEOUS | Status: DC

## 2020-06-25 MED ORDER — DIPHENHYDRAMINE HCL 12.5 MG/5ML PO ELIX: 12.5000 mg | ORAL_SOLUTION | ORAL | Status: DC | PRN

## 2020-06-25 MED ORDER — METHOCARBAMOL 1000 MG/10ML IJ SOLN
500.0000 mg | Freq: Four times a day (QID) | INTRAMUSCULAR | Status: DC | PRN
  Filled 2020-06-25: qty 5

## 2020-06-25 MED ORDER — BUPIVACAINE HCL (PF) 0.5 % IJ SOLN
INTRAMUSCULAR | Status: DC | PRN
  Administered 2020-06-25: 12 mg via INTRATHECAL

## 2020-06-25 MED ORDER — FENTANYL CITRATE (PF) 100 MCG/2ML IJ SOLN
INTRAMUSCULAR | Status: DC | PRN
  Administered 2020-06-25 (×2): 50 ug via INTRAVENOUS

## 2020-06-25 MED ORDER — CEFAZOLIN SODIUM-DEXTROSE 2-4 GM/100ML-% IV SOLN
2.0000 g | INTRAVENOUS | Status: AC
  Administered 2020-06-25: 2 g via INTRAVENOUS
  Filled 2020-06-25: qty 100

## 2020-06-25 MED ORDER — OXYCODONE HCL 5 MG/5ML PO SOLN: 5.0000 mg | Freq: Once | ORAL | Status: DC | PRN

## 2020-06-25 MED ORDER — POVIDONE-IODINE 10 % EX SWAB
2.0000 "application " | Freq: Once | CUTANEOUS | Status: AC
  Administered 2020-06-25: 2 via TOPICAL

## 2020-06-25 MED ORDER — ACETAMINOPHEN 10 MG/ML IV SOLN: 1000.0000 mg | Freq: Once | INTRAVENOUS | Status: DC | PRN

## 2020-06-25 MED ORDER — WATER FOR IRRIGATION, STERILE IR SOLN
Status: DC | PRN
  Administered 2020-06-25 (×2): 1000 mL

## 2020-06-25 MED ORDER — CEFAZOLIN SODIUM-DEXTROSE 2-4 GM/100ML-% IV SOLN
2.0000 g | Freq: Four times a day (QID) | INTRAVENOUS | Status: AC
  Administered 2020-06-25 (×2): 2 g via INTRAVENOUS
  Filled 2020-06-25 (×2): qty 100

## 2020-06-25 MED ORDER — ONDANSETRON HCL 4 MG/2ML IJ SOLN
4.0000 mg | Freq: Four times a day (QID) | INTRAMUSCULAR | Status: DC | PRN
  Administered 2020-06-25 – 2020-06-26 (×2): 4 mg via INTRAVENOUS
  Filled 2020-06-25 (×2): qty 2

## 2020-06-25 MED ORDER — MONTELUKAST SODIUM 10 MG PO TABS
10.0000 mg | ORAL_TABLET | Freq: Every day | ORAL | Status: DC
  Filled 2020-06-25 (×2): qty 1

## 2020-06-25 MED ORDER — ACETAMINOPHEN 500 MG PO TABS
1000.0000 mg | ORAL_TABLET | Freq: Once | ORAL | Status: AC
  Administered 2020-06-25: 1000 mg via ORAL
  Filled 2020-06-25: qty 2

## 2020-06-25 MED ORDER — TRANEXAMIC ACID-NACL 1000-0.7 MG/100ML-% IV SOLN
1000.0000 mg | Freq: Once | INTRAVENOUS | Status: AC
  Administered 2020-06-25: 1000 mg via INTRAVENOUS
  Filled 2020-06-25: qty 100

## 2020-06-25 MED ORDER — HYDROMORPHONE HCL 1 MG/ML IJ SOLN: 0.2500 mg | INTRAMUSCULAR | Status: DC | PRN

## 2020-06-25 MED ORDER — ONDANSETRON HCL 4 MG/2ML IJ SOLN
INTRAMUSCULAR | Status: AC
  Filled 2020-06-25: qty 2

## 2020-06-25 MED ORDER — POLYETHYLENE GLYCOL 3350 17 G PO PACK
17.0000 g | PACK | Freq: Every day | ORAL | Status: DC | PRN
  Administered 2020-06-26: 17 g via ORAL
  Filled 2020-06-25: qty 1

## 2020-06-25 MED ORDER — DEXAMETHASONE SODIUM PHOSPHATE 10 MG/ML IJ SOLN: 8.0000 mg | Freq: Once | INTRAMUSCULAR | Status: DC

## 2020-06-25 MED ORDER — ASPIRIN 81 MG PO CHEW
81.0000 mg | CHEWABLE_TABLET | Freq: Two times a day (BID) | ORAL | Status: DC
  Administered 2020-06-25 – 2020-06-27 (×4): 81 mg via ORAL
  Filled 2020-06-25 (×3): qty 1

## 2020-06-25 MED ORDER — ONDANSETRON HCL 4 MG PO TABS: 4.0000 mg | ORAL_TABLET | Freq: Four times a day (QID) | ORAL | Status: DC | PRN

## 2020-06-25 MED ORDER — ALUM & MAG HYDROXIDE-SIMETH 200-200-20 MG/5ML PO SUSP: 30.0000 mL | ORAL | Status: DC | PRN

## 2020-06-25 MED ORDER — METOCLOPRAMIDE HCL 5 MG PO TABS: 5.0000 mg | ORAL_TABLET | Freq: Three times a day (TID) | ORAL | Status: DC | PRN

## 2020-06-25 MED ORDER — FENTANYL CITRATE (PF) 100 MCG/2ML IJ SOLN
INTRAMUSCULAR | Status: AC
  Filled 2020-06-25: qty 2

## 2020-06-25 MED ORDER — DOCUSATE SODIUM 100 MG PO CAPS
100.0000 mg | ORAL_CAPSULE | Freq: Two times a day (BID) | ORAL | Status: DC
  Administered 2020-06-25 – 2020-06-27 (×5): 100 mg via ORAL
  Filled 2020-06-25 (×4): qty 1

## 2020-06-25 MED ORDER — 0.9 % SODIUM CHLORIDE (POUR BTL) OPTIME
TOPICAL | Status: DC | PRN
  Administered 2020-06-25: 1000 mL

## 2020-06-25 MED ORDER — BISACODYL 10 MG RE SUPP
10.0000 mg | Freq: Every day | RECTAL | Status: DC | PRN
  Administered 2020-06-26: 10 mg via RECTAL
  Filled 2020-06-25: qty 1

## 2020-06-25 MED ORDER — PROPOFOL 500 MG/50ML IV EMUL
INTRAVENOUS | Status: AC
  Filled 2020-06-25: qty 50

## 2020-06-25 MED ORDER — METHOCARBAMOL 500 MG PO TABS
500.0000 mg | ORAL_TABLET | Freq: Four times a day (QID) | ORAL | Status: DC | PRN
  Administered 2020-06-25 – 2020-06-26 (×3): 500 mg via ORAL
  Filled 2020-06-25 (×3): qty 1

## 2020-06-25 MED ORDER — ACETAMINOPHEN 500 MG PO TABS
1000.0000 mg | ORAL_TABLET | Freq: Four times a day (QID) | ORAL | Status: AC
  Administered 2020-06-25 – 2020-06-26 (×3): 1000 mg via ORAL
  Filled 2020-06-25 (×3): qty 2

## 2020-06-25 MED ORDER — ORAL CARE MOUTH RINSE: 15.0000 mL | Freq: Once | OROMUCOSAL | Status: AC

## 2020-06-25 MED ORDER — TRAMADOL HCL 50 MG PO TABS
50.0000 mg | ORAL_TABLET | Freq: Four times a day (QID) | ORAL | Status: DC
  Administered 2020-06-25 – 2020-06-27 (×8): 50 mg via ORAL
  Filled 2020-06-25 (×8): qty 1

## 2020-06-25 MED ORDER — DEXAMETHASONE SODIUM PHOSPHATE 10 MG/ML IJ SOLN
INTRAMUSCULAR | Status: AC
  Filled 2020-06-25: qty 1

## 2020-06-25 MED ORDER — MIDAZOLAM HCL 2 MG/2ML IJ SOLN
INTRAMUSCULAR | Status: DC | PRN
  Administered 2020-06-25: 2 mg via INTRAVENOUS

## 2020-06-25 MED ORDER — MAGNESIUM CITRATE PO SOLN
1.0000 | Freq: Once | ORAL | Status: AC | PRN
  Administered 2020-06-26: 1 via ORAL
  Filled 2020-06-25: qty 296

## 2020-06-25 MED ORDER — PROPOFOL 10 MG/ML IV BOLUS
INTRAVENOUS | Status: DC | PRN
  Administered 2020-06-25: 20 mg via INTRAVENOUS
  Administered 2020-06-25: 30 mg via INTRAVENOUS
  Administered 2020-06-25: 20 mg via INTRAVENOUS

## 2020-06-25 MED ORDER — DEXAMETHASONE SODIUM PHOSPHATE 10 MG/ML IJ SOLN
10.0000 mg | Freq: Once | INTRAMUSCULAR | Status: AC
  Administered 2020-06-26: 10 mg via INTRAVENOUS
  Filled 2020-06-25: qty 1

## 2020-06-25 MED ORDER — SODIUM CHLORIDE (PF) 0.9 % IJ SOLN
INTRAMUSCULAR | Status: AC
  Filled 2020-06-25: qty 20

## 2020-06-25 MED ORDER — ONDANSETRON HCL 4 MG/2ML IJ SOLN
INTRAMUSCULAR | Status: DC | PRN
  Administered 2020-06-25: 4 mg via INTRAVENOUS

## 2020-06-25 MED ORDER — TRANEXAMIC ACID-NACL 1000-0.7 MG/100ML-% IV SOLN
1000.0000 mg | INTRAVENOUS | Status: AC
  Administered 2020-06-25: 1000 mg via INTRAVENOUS
  Filled 2020-06-25: qty 100

## 2020-06-25 MED ORDER — METOCLOPRAMIDE HCL 5 MG/ML IJ SOLN: 5.0000 mg | Freq: Three times a day (TID) | INTRAMUSCULAR | Status: DC | PRN

## 2020-06-25 MED ORDER — PANTOPRAZOLE SODIUM 40 MG PO TBEC
40.0000 mg | DELAYED_RELEASE_TABLET | Freq: Every day | ORAL | Status: DC
  Administered 2020-06-25 – 2020-06-26 (×2): 40 mg via ORAL
  Filled 2020-06-25 (×2): qty 1

## 2020-06-25 MED ORDER — OXYCODONE HCL 5 MG PO TABS
5.0000 mg | ORAL_TABLET | ORAL | Status: DC | PRN
Start: 2020-06-25 — End: 2020-06-27
  Administered 2020-06-25: 5 mg via ORAL
  Administered 2020-06-25 – 2020-06-27 (×4): 10 mg via ORAL
  Filled 2020-06-25 (×2): qty 2
  Filled 2020-06-25: qty 1

## 2020-06-25 MED ORDER — CHLORHEXIDINE GLUCONATE 0.12 % MT SOLN
15.0000 mL | Freq: Once | OROMUCOSAL | Status: AC
  Administered 2020-06-25: 15 mL via OROMUCOSAL

## 2020-06-25 MED ORDER — LEVOCETIRIZINE DIHYDROCHLORIDE 5 MG PO TABS: 5.0000 mg | ORAL_TABLET | Freq: Every evening | ORAL | Status: DC

## 2020-06-25 MED ORDER — OXYCODONE HCL 5 MG PO TABS: 5.0000 mg | ORAL_TABLET | Freq: Once | ORAL | Status: DC | PRN

## 2020-06-25 MED ORDER — HYDROMORPHONE HCL 1 MG/ML IJ SOLN
0.5000 mg | INTRAMUSCULAR | Status: DC | PRN
Start: 2020-06-25 — End: 2020-06-27
  Administered 2020-06-25: 0.5 mg via INTRAVENOUS
  Administered 2020-06-25: 1 mg via INTRAVENOUS
  Filled 2020-06-25 (×2): qty 1

## 2020-06-25 MED ORDER — ONDANSETRON HCL 4 MG/2ML IJ SOLN
4.0000 mg | Freq: Once | INTRAMUSCULAR | Status: DC | PRN
Start: 2020-06-25 — End: 2020-06-25

## 2020-06-25 MED ORDER — MENTHOL 3 MG MT LOZG: 1.0000 | LOZENGE | OROMUCOSAL | Status: DC | PRN

## 2020-06-25 MED ORDER — LACTATED RINGERS IV SOLN: INTRAVENOUS | Status: DC

## 2020-06-25 MED ORDER — AMISULPRIDE (ANTIEMETIC) 5 MG/2ML IV SOLN: 10.0000 mg | Freq: Once | INTRAVENOUS | Status: DC | PRN

## 2020-06-25 MED ORDER — SODIUM CHLORIDE FLUSH 0.9 % IV SOLN
INTRAVENOUS | Status: DC | PRN
  Administered 2020-06-25: 20 mL

## 2020-06-25 MED ORDER — LORATADINE 10 MG PO TABS
10.0000 mg | ORAL_TABLET | Freq: Every evening | ORAL | Status: DC
  Administered 2020-06-25: 10 mg via ORAL
  Filled 2020-06-25 (×2): qty 1

## 2020-06-25 MED ORDER — DEXAMETHASONE SODIUM PHOSPHATE 10 MG/ML IJ SOLN
INTRAMUSCULAR | Status: DC | PRN
  Administered 2020-06-25: 8 mg via INTRAVENOUS

## 2020-06-25 MED ORDER — PHENYLEPHRINE 40 MCG/ML (10ML) SYRINGE FOR IV PUSH (FOR BLOOD PRESSURE SUPPORT)
PREFILLED_SYRINGE | INTRAVENOUS | Status: AC
  Filled 2020-06-25: qty 10

## 2020-06-25 MED ORDER — BUPIVACAINE LIPOSOME 1.3 % IJ SUSP
INTRAMUSCULAR | Status: DC | PRN
Start: 2020-06-25 — End: 2020-06-25
  Administered 2020-06-25: 10 mL

## 2020-06-25 MED ORDER — PHENOL 1.4 % MT LIQD: 1.0000 | OROMUCOSAL | Status: DC | PRN

## 2020-06-25 SURGICAL SUPPLY — 43 items
BAG ZIPLOCK 12X15 (MISCELLANEOUS) IMPLANT
BLADE SAG 18X100X1.27 (BLADE) ×2 IMPLANT
BLADE SURG SZ10 CARB STEEL (BLADE) IMPLANT
CHLORAPREP W/TINT 26 (MISCELLANEOUS) ×2 IMPLANT
CLSR STERI-STRIP ANTIMIC 1/2X4 (GAUZE/BANDAGES/DRESSINGS) ×2 IMPLANT
COVER PERINEAL POST (MISCELLANEOUS) ×2 IMPLANT
COVER SURGICAL LIGHT HANDLE (MISCELLANEOUS) ×2 IMPLANT
COVER WAND RF STERILE (DRAPES) IMPLANT
DECANTER SPIKE VIAL GLASS SM (MISCELLANEOUS) ×4 IMPLANT
DRAPE IMP U-DRAPE 54X76 (DRAPES) ×2 IMPLANT
DRAPE STERI IOBAN 125X83 (DRAPES) ×2 IMPLANT
DRAPE U-SHAPE 47X51 STRL (DRAPES) ×4 IMPLANT
DRSG MEPILEX BORDER 4X8 (GAUZE/BANDAGES/DRESSINGS) ×2 IMPLANT
ELECT REM PT RETURN 15FT ADLT (MISCELLANEOUS) ×2 IMPLANT
GLOVE SRG 8 PF TXTR STRL LF DI (GLOVE) ×1 IMPLANT
GLOVE SURG ENC MOIS LTX SZ7.5 (GLOVE) ×2 IMPLANT
GLOVE SURG POLYISO LF SZ7.5 (GLOVE) ×2 IMPLANT
GLOVE SURG UNDER POLY LF SZ7.5 (GLOVE) ×2 IMPLANT
GLOVE SURG UNDER POLY LF SZ8 (GLOVE) ×1
GOWN STRL REUS W/TWL LRG LVL3 (GOWN DISPOSABLE) ×2 IMPLANT
GOWN STRL REUS W/TWL XL LVL3 (GOWN DISPOSABLE) ×2 IMPLANT
HEAD BIOLOX HIP 36/-2.5 (Joint) ×1 IMPLANT
HIP BIOLOX HD 36/-2.5 (Joint) ×2 IMPLANT
HOLDER FOLEY CATH W/STRAP (MISCELLANEOUS) IMPLANT
INSERT 0 DEGREE 36 (Miscellaneous) ×2 IMPLANT
KIT TURNOVER KIT A (KITS) ×2 IMPLANT
MANIFOLD NEPTUNE II (INSTRUMENTS) ×2 IMPLANT
NS IRRIG 1000ML POUR BTL (IV SOLUTION) ×2 IMPLANT
PACK ANTERIOR HIP CUSTOM (KITS) ×2 IMPLANT
PROTECTOR NERVE ULNAR (MISCELLANEOUS) ×2 IMPLANT
SCREW HEX LP 6.5X20 (Screw) ×2 IMPLANT
SHELL TRIDENT II CLUST 50 (Shell) ×2 IMPLANT
STEM FEM ACCOLADE 38X102X30 S3 (Stem) ×2 IMPLANT
SUT MNCRL AB 3-0 PS2 18 (SUTURE) ×2 IMPLANT
SUT STRATAFIX 0 PDS 27 VIOLET (SUTURE) ×2
SUT VIC AB 0 CT1 36 (SUTURE) ×2 IMPLANT
SUT VIC AB 1 CT1 36 (SUTURE) ×2 IMPLANT
SUT VIC AB 2-0 CT1 27 (SUTURE) ×2
SUT VIC AB 2-0 CT1 TAPERPNT 27 (SUTURE) ×2 IMPLANT
SUTURE STRATFX 0 PDS 27 VIOLET (SUTURE) ×1 IMPLANT
TRAY FOLEY MTR SLVR 16FR STAT (SET/KITS/TRAYS/PACK) IMPLANT
TUBE SUCTION HIGH CAP CLEAR NV (SUCTIONS) ×2 IMPLANT
WATER STERILE IRR 1000ML POUR (IV SOLUTION) ×4 IMPLANT

## 2020-06-25 NOTE — Transfer of Care (Signed)
Immediate Anesthesia Transfer of Care Note  Patient: Lindsey Sanchez  Procedure(s) Performed: TOTAL HIP ARTHROPLASTY ANTERIOR APPROACH (Left: Hip)  Patient Location: PACU  Anesthesia Type:Spinal  Level of Consciousness: awake, alert  and oriented  Airway & Oxygen Therapy: Patient Spontanous Breathing and Patient connected to face mask oxygen  Post-op Assessment: Report given to RN and Post -op Vital signs reviewed and stable  Post vital signs: Reviewed and stable  Last Vitals:  Vitals Value Taken Time  BP 114/72   Temp    Pulse 93 06/25/20 0929  Resp 16 06/25/20 0929  SpO2 100 % 06/25/20 0929  Vitals shown include unvalidated device data.  Last Pain:  Vitals:   06/25/20 0553  TempSrc: Oral  PainSc:          Complications: No notable events documented.

## 2020-06-25 NOTE — Op Note (Signed)
06/25/2020  10:03 AM  PATIENT:  Lindsey Sanchez   MRN: [id number]  PRE-OPERATIVE DIAGNOSIS:  OA LEFT HIP  POST-OPERATIVE DIAGNOSIS:  OA LEFT HIP  PROCEDURE:  Procedure(s): TOTAL HIP ARTHROPLASTY ANTERIOR APPROACH  PREOPERATIVE INDICATIONS:    Roopa Graver is an 50 y.o. female who has a diagnosis of <principal problem not specified> and elected for surgical management after failing conservative treatment.  The risks benefits and alternatives were discussed with the patient including but not limited to the risks of nonoperative treatment, versus surgical intervention including infection, bleeding, nerve injury, periprosthetic fracture, the need for revision surgery, dislocation, leg length discrepancy, blood clots, cardiopulmonary complications, morbidity, mortality, among others, and they were willing to proceed.     OPERATIVE REPORT     SURGEON:   Renette Butters, MD    ASSISTANT:  Aggie Moats, PA-C, he was present and scrubbed throughout the case, critical for completion in a timely fashion, and for retraction, instrumentation, and closure.     ANESTHESIA:  General    COMPLICATIONS:  None.     COMPONENTS:  Stryker acolade fit femur size 3 with a 36 mm -2.5 head ball and an acetabular shell size 50 with a  polyethylene liner    PROCEDURE IN DETAIL:   The patient was met in the holding area and  identified.  The appropriate hip was identified and marked at the operative site.  The patient was then transported to the OR  and  placed under anesthesia per that record.  At that point, the patient was  placed in the supine position and  secured to the operating room table and all bony prominences padded. He received pre-operative antibiotics    The operative lower extremity was prepped from the iliac crest to the distal leg.  Sterile draping was performed.  Time out was performed prior to incision.      Skin incision was made just 2 cm lateral to the ASIS  extending in line with the  tensor fascia lata. Electrocautery was used to control all bleeders. I dissected down sharply to the fascia of the tensor fascia lata was confirmed that the muscle fibers beneath were running posteriorly. I then incised the fascia over the superficial tensor fascia lata in line with the incision. The fascia was elevated off the anterior aspect of the muscle the muscle was retracted posteriorly and protected throughout the case. I then used electrocautery to incise the tensor fascia lata fascia control and all bleeders. Immediately visible was the fat over top of the anterior neck and capsule.  I removed the anterior fat from the capsule and elevated the rectus muscle off of the anterior capsule. I then removed a large time of capsule. The retractors were then placed over the anterior acetabulum as well as around the superior and inferior neck.  I then made a femoral neck cut. Then used the power corkscrew to remove the femoral head from the acetabulum and thoroughly irrigated the acetabulum. I sized the femoral head.    I then exposed the deep acetabulum, cleared out any tissue including the ligamentum teres.   After adequate visualization, I excised the labrum, and then sequentially reamed.  I then impacted the acetabular implant into place using fluoroscopy for guidance.  Appropriate version and inclination was confirmed clinically matching their bony anatomy, and with fluoroscopy.  I placed a 20 mm screw in the posterior/superio position with an excellent bite.    I then placed the polyethylene liner in place  I then adducted the leg and released the external rotators from the posterior femur allowing it to be easily delivered up lateral and anterior to the acetabulum for preparation of the femoral canal.    I then prepared the proximal femur using the cookie-cutter and then sequentially reamed and broached.  A trial broach, neck, and head was utilized, and I reduced the hip and used floroscopy to  assess the neck length and femoral implant.  I then impacted the femoral prosthesis into place into the appropriate version. The hip was then reduced and fluoroscopy confirmed appropriate position. Leg lengths were restored.  I then irrigated the hip copiously again with, and repaired the fascia with Vicryl, followed by monocryl for the subcutaneous tissue, Monocryl for the skin, Steri-Strips and sterile gauze. The patient was then awakened and returned to PACU in stable and satisfactory condition. There were no complications.  POST OPERATIVE PLAN: WBAT, DVT px: SCD's/TED, ambulation and chemical dvt px  Edmonia Lynch, MD Orthopedic Surgeon [phone]

## 2020-06-25 NOTE — Evaluation (Signed)
Physical Therapy Evaluation Patient Details Name: Lindsey Sanchez MRN: [id number] DOB: 04-Nov-1970 Today's Date: 06/25/2020   History of Present Illness  s/p L DA THA. PMH: pre-diabetes  Clinical Impression  Pt ts s/p THA resulting in the deficits listed below (see PT Problem List).  Pt motivated to mobilize however limited to bed to chair transfer d/t nausea. Will follow in acute setting. Anticipate steady progress  Pt will benefit from skilled PT to increase their independence and safety with mobility to allow discharge to the venue listed below.      Follow Up Recommendations Follow surgeon's recommendation for DC plan and follow-up therapies    Equipment Recommendations       Recommendations for Other Services       Precautions / Restrictions Precautions Precautions: Fall Restrictions Weight Bearing Restrictions: No Other Position/Activity Restrictions: WBAT      Mobility  Bed Mobility Overal bed mobility: Needs Assistance Bed Mobility: Supine to Sit     Supine to sit: Min assist     General bed mobility comments: assist with LLE, incr time    Transfers Overall transfer level: Needs assistance Equipment used: Rolling walker (2 wheeled) Transfers: Sit to/from [employer] Sit to Stand: Min assist Stand pivot transfers: Min assist       General transfer comment: cues for hand placement, and LLE position; limited to stand pivot with RW d/t nausea  Ambulation/Gait                Stairs            Wheelchair Mobility    Modified Rankin (Stroke Patients Only)       Balance                                             Pertinent Vitals/Pain Pain Assessment: 0-10 Pain Score: 4  Pain Location: L hip pain Pain Descriptors / Indicators: Aching;Grimacing;Sore Pain Intervention(s): Limited activity within patient's tolerance;Monitored during session;Premedicated before session;Repositioned    Home Living  Family/patient expects to be discharged to:: Private residence Living Arrangements: Children Available Help at Discharge: Family Type of Home: House Home Access: Stairs to enter   Secretary/administrator of Steps: 2 Home Layout: One level Home Equipment: None      Prior Function Level of Independence: Independent               Hand Dominance        Extremity/Trunk Assessment   Upper Extremity Assessment Upper Extremity Assessment: Defer to OT evaluation;Overall WFL for tasks assessed    Lower Extremity Assessment Lower Extremity Assessment: LLE deficits/detail LLE Deficits / Details: ankle WFL, knee grossly 3/5, hip limited by pain       Communication   Communication: No difficulties  Cognition Arousal/Alertness: Awake/alert Behavior During Therapy: WFL for tasks assessed/performed Overall Cognitive Status: Within Functional Limits for tasks assessed                                        General Comments      Exercises Total Joint Exercises Ankle Circles/Pumps: AROM;10 reps;Both   Assessment/Plan    PT Assessment Patient needs continued PT services  PT Problem List Decreased strength;Decreased mobility;Decreased activity tolerance;Decreased knowledge of use of DME;Pain  PT Treatment Interventions DME instruction;Gait training;Functional mobility training;Therapeutic activities;Therapeutic exercise;Patient/family education    PT Goals (Current goals can be found in the Care Plan section)  Acute Rehab PT Goals Patient Stated Goal: less hip pain PT Goal Formulation: With patient Time For Goal Achievement: 07/02/20 Potential to Achieve Goals: Good    Frequency 7X/week   Barriers to discharge        Co-evaluation               AM-PAC PT "6 Clicks" Mobility  Outcome Measure Help needed turning from your back to your side while in a flat bed without using bedrails?: A Little Help needed moving from lying on your back to  sitting on the side of a flat bed without using bedrails?: A Little Help needed moving to and from a bed to a chair (including a wheelchair)?: A Little Help needed standing up from a chair using your arms (e.g., wheelchair or bedside chair)?: A Little Help needed to walk in hospital room?: A Lot Help needed climbing 3-5 steps with a railing? : A Lot 6 Click Score: 16    End of Session Equipment Utilized During Treatment: Gait belt Activity Tolerance: Patient tolerated treatment well Patient left: in chair;with call bell/phone within reach;with chair alarm set;with nursing/sitter in room   PT Visit Diagnosis: Difficulty in walking, not elsewhere classified (R26.2)    Time: 1500-1530 PT Time Calculation (min) (ACUTE ONLY): 30 min   Charges:   PT Evaluation $PT Eval Low Complexity: 1 Low PT Treatments $Therapeutic Activity: 8-22 mins        Delice Bison, PT  Acute Rehab Dept ([facility]) [phone] Pager [phone]  06/25/2020   [facility] 06/25/2020, 3:53 PM

## 2020-06-25 NOTE — Anesthesia Postprocedure Evaluation (Signed)
Anesthesia Post Note  Patient: Lindsey Sanchez  Procedure(s) Performed: TOTAL HIP ARTHROPLASTY ANTERIOR APPROACH (Left: Hip)     Patient location during evaluation: Nursing Unit Anesthesia Type: Spinal Level of consciousness: oriented and awake and alert Pain management: pain level controlled Vital Signs Assessment: post-procedure vital signs reviewed and stable Respiratory status: spontaneous breathing and respiratory function stable Cardiovascular status: blood pressure returned to baseline and stable Postop Assessment: no headache, no backache, no apparent nausea or vomiting and patient able to bend at knees Anesthetic complications: no   No notable events documented.  Last Vitals:  Vitals:   06/25/20 1015 06/25/20 1030  BP: 119/76 117/74  Pulse: 65 66  Resp: 14 12  Temp:    SpO2: 100% 100%    Last Pain:  Vitals:   06/25/20 1030  TempSrc:   PainSc: 0-No pain                 Trevor Iha

## 2020-06-25 NOTE — Anesthesia Procedure Notes (Signed)
Spinal  Patient location during procedure: OR Start time: 06/25/2020 7:15 AM End time: 06/25/2020 7:19 AM Reason for block: surgical anesthesia Staffing Performed: anesthesiologist  Anesthesiologist: Trevor Iha, MD Preanesthetic Checklist Completed: patient identified, IV checked, site marked, risks and benefits discussed, surgical consent, monitors and equipment checked, pre-op evaluation and timeout performed Spinal Block Patient position: sitting Prep: DuraPrep Patient monitoring: heart rate, cardiac monitor, continuous pulse ox and blood pressure Approach: midline Location: L3-4 Injection technique: single-shot Needle Needle type: Sprotte  Needle gauge: 24 G Needle length: 9 cm Needle insertion depth: 8 cm Assessment Sensory level: T4 Events: CSF return Additional Notes ! Attempt Patient tolerated procedure well

## 2020-06-26 ENCOUNTER — Encounter (HOSPITAL_COMMUNITY): Payer: Self-pay | Admitting: Orthopedic Surgery

## 2020-06-26 DIAGNOSIS — M1612 Unilateral primary osteoarthritis, left hip: Secondary | ICD-10-CM | POA: Diagnosis not present

## 2020-06-26 NOTE — Plan of Care (Signed)
  Problem: Activity: Goal: Ability to tolerate increased activity will improve Outcome: Progressing   Problem: Pain Management: Goal: Pain level will decrease with appropriate interventions Outcome: Progressing   

## 2020-06-26 NOTE — Discharge Instructions (Signed)

## 2020-06-26 NOTE — Plan of Care (Signed)
  Problem: Activity: Goal: Ability to avoid complications of mobility impairment will improve Outcome: Progressing Goal: Ability to tolerate increased activity will improve Outcome: Progressing   Problem: Pain Management: Goal: Pain level will decrease with appropriate interventions Outcome: Progressing   

## 2020-06-26 NOTE — Evaluation (Signed)
Occupational Therapy Evaluation Patient Details Name: Lindsey Sanchez MRN: [id number] DOB: Jul 31, 1970 Today's Date: 06/26/2020    History of Present Illness s/p L DA THA. PMH: pre-diabetes   Clinical Impression   Patient lives at home with children and is independent at baseline with self care. Currently patient reporting 8/10 pain and nausea with mobility impacting overall activity tolerance, balance and safety. Patient overall min G for safety with functional ambulation/transfers using rolling walker, with cue to keep both hands on walker when turning to sit into recliner. Patient unable to perform LB dressing on L side 2* pain, educated and demonstrate use of reacher + sock aid, patient return demonstrate then educated where she can purchase hip kits if interested for home. Also educate on benefits of 3 in 1 vs toilet riser as she can also put 3 in 1 in shower to use as a seat. Patient reporting increased nausea at end of session sitting up in chair, RN made aware. Recommend continued acute OT services to maximize patient safety and independence with self care in order to facilitate D/C home.     Follow Up Recommendations  No OT follow up;Supervision/Assistance - 24 hour (at least initial)    Equipment Recommendations  3 in 1 bedside commode;Other (comment) (rolling walker, hip kit-adaptive equipment)       Precautions / Restrictions Precautions Precautions: Fall Restrictions Other Position/Activity Restrictions: WBAT      Mobility Bed Mobility Overal bed mobility: Needs Assistance Bed Mobility: Supine to Sit     Supine to sit: Min guard;HOB elevated     General bed mobility comments: for L Le    Transfers Overall transfer level: Needs assistance Equipment used: Rolling walker (2 wheeled) Transfers: Sit to/from Stand Sit to Stand: Min guard;From elevated surface         General transfer comment: cues for safety not to let go of walker when transferring to recliner     Balance Overall balance assessment: Needs assistance Sitting-balance support: Feet supported Sitting balance-Leahy Scale: Good     Standing balance support: Single extremity supported;Bilateral upper extremity supported Standing balance-Leahy Scale: Poor Standing balance comment: heavy reliance on UEs with ambulation, holds sink during g/h                           ADL either performed or assessed with clinical judgement   ADL Overall ADL's : Needs assistance/impaired Eating/Feeding: Independent   Grooming: Oral care;Min guard;Standing Grooming Details (indicate cue type and reason): patient reporting nausea, min G for safety in standing Upper Body Bathing: Set up;Sitting   Lower Body Bathing: Moderate assistance;Sitting/lateral leans;Sit to/from stand   Upper Body Dressing : Set up;Sitting   Lower Body Dressing: Moderate assistance;Sitting/lateral leans;Sit to/from stand Lower Body Dressing Details (indicate cue type and reason): patient unable to doff/don L sock. Educated patient in [place] + sock aid that come in hip kit. patient return demonstrate use of reacher and sock aid to practice don/doff sock and informed where she can purchase Toilet Transfer: Min guard;Ambulation;RW Toilet Transfer Details (indicate cue type and reason): min G for safety no physical assistance to power up to standing from elevated bed height. Needs cue to keep hold of walker when feeling nauseous during transfer to [place] and Hygiene: Minimal assistance;Sitting/lateral lean;Sit to/from stand       Functional mobility during ADLs: Min guard;Rolling walker;Cueing for safety        Pertinent Vitals/Pain Pain Assessment: 0-10  Pain Score: 8  Pain Location: L hip pain Pain Descriptors / Indicators: Burning;Grimacing Pain Intervention(s): Monitored during session;Other (comment) (patient requesting nausea medication)     Hand Dominance Right    Extremity/Trunk Assessment Upper Extremity Assessment Upper Extremity Assessment: Overall WFL for tasks assessed   Lower Extremity Assessment Lower Extremity Assessment: Defer to PT evaluation   Cervical / Trunk Assessment Cervical / Trunk Assessment: Normal   Communication Communication Communication: No difficulties   Cognition Arousal/Alertness: Awake/alert Behavior During Therapy: Flat affect Overall Cognitive Status: Within Functional Limits for tasks assessed                                                Home Living Family/patient expects to be discharged to:: Private residence Living Arrangements: Children Available Help at Discharge: Family Type of Home: House Home Access: Stairs to enter Technical brewer of Steps: 2   Home Layout: One level     Bathroom Shower/Tub: Tub/shower unit;Walk-in shower   Bathroom Toilet: Standard     Home Equipment:  (patient reports getting walker and toilet riser delivered)          Prior Functioning/Environment Level of Independence: Independent                 OT Problem List: Decreased activity tolerance;Impaired balance (sitting and/or standing);Decreased safety awareness;Decreased knowledge of use of DME or AE;Pain      OT Treatment/Interventions: Self-care/ADL training;DME and/or AE instruction;Therapeutic activities;Patient/family education;Balance training    OT Goals(Current goals can be found in the care plan section) Acute Rehab OT Goals Patient Stated Goal: less pain and nausea OT Goal Formulation: With patient Time For Goal Achievement: 07/10/20 Potential to Achieve Goals: Good  OT Frequency: Min 2X/week    AM-PAC OT "6 Clicks" Daily Activity     Outcome Measure Help from another person eating meals?: None Help from another person taking care of personal grooming?: A Little Help from another person toileting, which includes using toliet, bedpan, or urinal?: A Little Help  from another person bathing (including washing, rinsing, drying)?: A Lot Help from another person to put on and taking off regular upper body clothing?: A Little Help from another person to put on and taking off regular lower body clothing?: A Lot 6 Click Score: 17   End of Session Equipment Utilized During Treatment: Rolling walker Nurse Communication: Mobility status;Other (comment) (requesting nausea medications)  Activity Tolerance: Patient limited by pain;Other (comment) (Patient limited by nausea) Patient left: in chair;with call bell/phone within reach;with chair alarm set  OT Visit Diagnosis: Pain;Unsteadiness on feet (R26.81);Other abnormalities of gait and mobility (R26.89) Pain - Right/Left: Left Pain - part of body: Hip                Time: 1423-9532 OT Time Calculation (min): 26 min Charges:  OT General Charges $OT Visit: 1 Visit OT Evaluation $OT Eval Low Complexity: 1 Low OT Treatments $Self Care/Home Management : 8-22 mins  Delbert Phenix OT OT pager: [phone]  Rosemary Holms 06/26/2020, 9:40 AM

## 2020-06-26 NOTE — Progress Notes (Signed)
Physical Therapy Treatment Patient Details Name: Lindsey Sanchez MRN: [id number] DOB: 1970/10/10 Today's Date: 06/26/2020    History of Present Illness s/p L DA THA. PMH: pre-diabetes    PT Comments    POD # 1 am session was limited General bed mobility comments: demonstarted and instructed how to use a belt to self assist LE.  Also required increased time. General transfer comment: 25% VC's on proper hand placement and safety with turns.  General Gait Details: 25% VC's on proper walker to self distance and MAX encouragement to "work thru the [employer]" however pt only took a few steps forward then self aborted stated "Not going to happen" and stepped back to bed.  MAX c/o nausea and ABD distention stated she has not had a BM for several days. "I feel like you are pushing me.  I just had surgery yesterday". Assisted back to bed per pt request and applied ICE.    Follow Up Recommendations  Follow surgeon's recommendation for DC plan and follow-up therapies;Home health PT     Equipment Recommendations  Rolling walker with 5" wheels;3in1 (PT)    Recommendations for Other Services       Precautions / Restrictions Precautions Precautions: Fall Restrictions Weight Bearing Restrictions: No Other Position/Activity Restrictions: WBAT    Mobility  Bed Mobility Overal bed mobility: Needs Assistance Bed Mobility: Supine to Sit     Supine to sit: Supervision;Min guard     General bed mobility comments: demonstarted and instructed how to use a belt to self assist LE.  Also required increased time.    Transfers Overall transfer level: Needs assistance Equipment used: Rolling walker (2 wheeled) Transfers: Sit to/from Stand Sit to Stand: Min guard;From elevated surface Stand pivot transfers: Min assist       General transfer comment: 25% VC's on proper hand placement and safety with turns  Ambulation/Gait Ambulation/Gait assistance: Supervision;Min guard Gait Distance (Feet): 2  Feet Assistive device: Rolling walker (2 wheeled) Gait Pattern/deviations: Step-to pattern;Decreased stance time - left Gait velocity: decreased   General Gait Details: 25% VC's on proper walker to self distance and MAX encouragement to "work thru the [employer] however pt only took a few steps forward then self aborted stated "Not going to happen" and stepped back to bed.  MAX c/o nausea and ABD distention stated she has not had a BM for several days.   Stairs             Wheelchair Mobility    Modified Rankin (Stroke Patients Only)       Balance                                            Cognition Arousal/Alertness: Awake/alert Behavior During Therapy: Flat affect Overall Cognitive Status: Within Functional Limits for tasks assessed                                 General Comments: AxO x 3 not feeling well "bloated" and nauseated      Exercises      General Comments        Pertinent Vitals/Pain Pain Assessment: 0-10 Pain Score: 6  Pain Location: L hip pain Pain Descriptors / Indicators: Burning;Grimacing;Operative site guarding Pain Intervention(s): Monitored during session;Premedicated before session;Repositioned;Ice applied    Home Living  Prior Function            PT Goals (current goals can now be found in the care plan section) Progress towards PT goals: Progressing toward goals    Frequency    7X/week      PT Plan Current plan remains appropriate    Co-evaluation              AM-PAC PT "6 Clicks" Mobility   Outcome Measure  Help needed turning from your back to your side while in a flat bed without using bedrails?: A Little Help needed moving from lying on your back to sitting on the side of a flat bed without using bedrails?: A Little Help needed moving to and from a bed to a chair (including a wheelchair)?: A Little Help needed standing up from a chair using your  arms (e.g., wheelchair or bedside chair)?: A Little Help needed to walk in hospital room?: A Little Help needed climbing 3-5 steps with a railing? : A Little 6 Click Score: 18    End of Session Equipment Utilized During Treatment: Gait belt Activity Tolerance: Other (comment) (nausea/ABD bloating) Patient left: in bed;with call bell/phone within reach Nurse Communication: Mobility status PT Visit Diagnosis: Difficulty in walking, not elsewhere classified (R26.2)     Time: 8016-5537 PT Time Calculation (min) (ACUTE ONLY): 25 min  Charges:  $Gait Training: 8-22 mins $Therapeutic Activity: 8-22 mins                     Felecia Shelling  PTA Acute  Rehabilitation Services Pager      [phone] Office      [phone]

## 2020-06-26 NOTE — Progress Notes (Signed)
Physical Therapy Treatment Patient Details Name: Lindsey Sanchez MRN: [id number] DOB: [birth year] Today's Date: 06/26/2020    History of Present Illness s/p L DA THA. PMH: pre-diabetes    PT Comments    POD # 1 pm session Assisted pt OOB to amb to bathroom.   General transfer comment: 25% VC's on proper hand placement and safety with turns also assisted to bathroom.  General Gait Details: one VC on safety with turns assisted with amb to and from bathroom.  Pt feeling a little better but not yet willing to amb in hallway. Pt given handout THR HEP but did not perform due to fatigue.   Pt has NOT met goals to D/C to home today with issues of nausea.  Follow Up Recommendations  Follow surgeon's recommendation for DC plan and follow-up therapies;Home health PT     Equipment Recommendations  Rolling walker with 5" wheels;3in1 (PT)    Recommendations for Other Services       Precautions / Restrictions Precautions Precautions: Fall Restrictions Weight Bearing Restrictions: No Other Position/Activity Restrictions: WBAT    Mobility  Bed Mobility Overal bed mobility: Needs Assistance Bed Mobility: Supine to Sit     Supine to sit: Supervision;Min guard     General bed mobility comments: demonstarted and instructed how to use a belt to self assist LE.  Also required increased time.    Transfers Overall transfer level: Needs assistance Equipment used: Rolling walker (2 wheeled) Transfers: Sit to/from Stand Sit to Stand: Min guard;From elevated surface Stand pivot transfers: Min assist       General transfer comment: 25% VC's on proper hand placement and safety with turns also assisted to bathroom  Ambulation/Gait Ambulation/Gait assistance: Supervision;Min guard Gait Distance (Feet): 22 Feet (11 feet x 2) Assistive device: Rolling walker (2 wheeled) Gait Pattern/deviations: Step-to pattern;Decreased stance time - left Gait velocity: decreased   General Gait Details: one VC  on safety with turns assisted with amb to and from bathroom.  Pt feeling a little better but not yet willing to amb in hallway.   Stairs             Wheelchair Mobility    Modified Rankin (Stroke Patients Only)       Balance                                            Cognition Arousal/Alertness: Awake/alert Behavior During Therapy: Flat affect Overall Cognitive Status: Within Functional Limits for tasks assessed                                 General Comments: AxO x 3 not feeling well "bloated" and nauseated      Exercises      General Comments        Pertinent Vitals/Pain Pain Assessment: 0-10 Pain Score: 6  Pain Location: L hip pain Pain Descriptors / Indicators: Burning;Grimacing;Operative site guarding Pain Intervention(s): Monitored during session;Premedicated before session;Repositioned;Ice applied    Home Living                      Prior Function            PT Goals (current goals can now be found in the care plan section) Progress towards PT goals: Progressing toward goals    Frequency  7X/week      PT Plan Current plan remains appropriate    Co-evaluation              AM-PAC PT "6 Clicks" Mobility   Outcome Measure  Help needed turning from your back to your side while in a flat bed without using bedrails?: A Little Help needed moving from lying on your back to sitting on the side of a flat bed without using bedrails?: A Little Help needed moving to and from a bed to a chair (including a wheelchair)?: A Little Help needed standing up from a chair using your arms (e.g., wheelchair or bedside chair)?: A Little Help needed to walk in hospital room?: A Little Help needed climbing 3-5 steps with a railing? : A Little 6 Click Score: 18    End of Session Equipment Utilized During Treatment: Gait belt Activity Tolerance: Other (comment) (nausea/ABD bloating) Patient left: in  chair Nurse Communication: Mobility status PT Visit Diagnosis: Difficulty in walking, not elsewhere classified (R26.2)     Time: 7373-6681 PT Time Calculation (min) (ACUTE ONLY): 20 min  Charges:  $Gait Training: 8-22 mins                     Rica Koyanagi  PTA Acute  Rehabilitation Services Pager      [phone] Office      [phone]

## 2020-06-26 NOTE — TOC Transition Note (Signed)
Transition of Care [facility]) - CM/SW Discharge Note   Patient Details  Name: Lindsey Sanchez MRN: [id number] Date of Birth: 26-Aug-1970  Transition of Care [facility]) CM/SW Contact:  Lennart Pall, LCSW Phone Number: 06/26/2020, 11:48 AM   Clinical Narrative:    Met with pt this morning and confirming she has received her commode and walker.  Plan for OPPT at [facility] facility.  No further TOC needs.   Final next level of care: OP Rehab Barriers to Discharge: Continued Medical Work up   Patient Goals and CMS Choice Patient states their goals for this hospitalization and ongoing recovery are:: return home      Discharge Placement                       Discharge Plan and Services                DME Arranged: 3-N-1, Walker rolling DME Agency: [employer] Date DME Agency Contacted:  (orders placed prior to surgery)   Representative spoke with at DME Agency: Cristie Hem            Social Determinants of Health ([place]) Interventions     Readmission Risk Interventions No flowsheet data found.

## 2020-06-26 NOTE — Progress Notes (Signed)
    Subjective: Patient reports pain as mild to moderate.  Tolerating diet.  Urinating.   No CP, SOB.  Has mobilized some with PT/OT OOB but got nauseous and had to stop. Hopefully will have a better session today.  Objective:   VITALS:   Vitals:   06/25/20 1315 06/25/20 2108 06/26/20 0055 06/26/20 0451  BP: 128/84 104/83 121/75 109/69  Pulse: 80 69 70 73  Resp: 16 16 16 17   Temp: 98.3 F (36.8 C) 98.5 F (36.9 C) 98.3 F (36.8 C) 98.4 F (36.9 C)  TempSrc:  Oral Oral Oral  SpO2: 99% 100% 99% 98%  Weight:      Height:       CBC Latest Ref Rng & Units 06/17/2020 08/30/2015 10/03/2009  WBC 4.0 - 10.5 K/uL 4.6 5.3 16.5(H)  Hemoglobin 12.0 - 15.0 g/dL 10/05/2009 10.2(L) 9.5(L)  Hematocrit 36.0 - 46.0 % 41.4 30.5(L) 27.7(L)  Platelets 150 - 400 K/uL 289 333 259 DELTA CHECK NOTED REPEATED TO VERIFY   BMP Latest Ref Rng & Units 06/17/2020  Glucose 70 - 99 mg/dL 08/17/2020)  BUN 6 - 20 mg/dL 7  Creatinine 546(F - 6.81 mg/dL 2.75  Sodium 1.70 - 017 mmol/L 136  Potassium 3.5 - 5.1 mmol/L 3.9  Chloride 98 - 111 mmol/L 103  CO2 22 - 32 mmol/L 26  Calcium 8.9 - 10.3 mg/dL 9.6   Intake/Output      06/14 0701 06/15 0700 06/15 0701 06/16 0700   P.O. 720 50   I.V. (mL/kg) 1400 (13.2)    IV Piggyback 200    Total Intake(mL/kg) 2320 (21.9) 50 (0.5)   Urine (mL/kg/hr) 2050 (0.8)    Stool 0    Blood 300    Total Output 2350    Net -30 +50        Urine Occurrence 1 x 0 x   Stool Occurrence 0 x 0 x      Physical Exam: General: NAD. Laying in bed. Calm, comfortable Resp: No increased wob Cardio: regular rate and rhythm ABD soft Neurologically intact MSK Neurovascularly intact Sensation intact distally Intact pulses distally Dorsiflexion/Plantar flexion intact Incision: dressing C/D/I   Assessment: 1 Day Post-Op  S/P Procedure(s) (LRB): TOTAL HIP ARTHROPLASTY ANTERIOR APPROACH (Left) by Dr. 7/16. Murphy on 06/25/20  Active Problems:   S/P total left hip  arthroplasty   Plan: Advance diet Up with therapy Incentive Spirometry Elevate and Apply ice  Weightbearing: WBAT LLE Insicional and dressing care: Dressings left intact until follow-up and Reinforce dressings as needed Orthopedic device(s): None Showering: Keep dressing dry VTE prophylaxis: Aspirin 81mg  BID  x 30 days post op , SCDs, ambulation Pain control: Continue current regimen Follow - up plan: 2 weeks Contact information:  06/27/20 MD, PA-C  Dispo: Home hopefully later today pending more PT/OT evals.    Lindsey Rana, PA-C Office [phone] 06/26/2020, 8:16 AM

## 2020-06-27 ENCOUNTER — Ambulatory Visit: Payer: Medicaid Other | Admitting: Physical Therapy

## 2020-06-27 DIAGNOSIS — M1612 Unilateral primary osteoarthritis, left hip: Secondary | ICD-10-CM | POA: Diagnosis not present

## 2020-06-27 MED ORDER — ACETAMINOPHEN 500 MG PO TABS: 1000.0000 mg | ORAL_TABLET | Freq: Four times a day (QID) | ORAL | 0 refills | Status: AC | PRN

## 2020-06-27 MED ORDER — OXYCODONE HCL 5 MG PO TABS: 5.0000 mg | ORAL_TABLET | Freq: Four times a day (QID) | ORAL | 0 refills | Status: DC | PRN

## 2020-06-27 MED ORDER — OMEPRAZOLE MAGNESIUM 20 MG PO TBEC: 20.0000 mg | DELAYED_RELEASE_TABLET | Freq: Every day | ORAL | 0 refills | Status: DC

## 2020-06-27 MED ORDER — METHOCARBAMOL 500 MG PO TABS: 500.0000 mg | ORAL_TABLET | Freq: Three times a day (TID) | ORAL | 0 refills | Status: DC | PRN

## 2020-06-27 MED ORDER — MELOXICAM 15 MG PO TABS: 15.0000 mg | ORAL_TABLET | Freq: Every day | ORAL | 0 refills | Status: AC | PRN

## 2020-06-27 MED ORDER — ONDANSETRON HCL 4 MG PO TABS: 4.0000 mg | ORAL_TABLET | Freq: Every day | ORAL | 0 refills | Status: DC | PRN

## 2020-06-27 MED ORDER — ASPIRIN 81 MG PO CHEW: 81.0000 mg | CHEWABLE_TABLET | Freq: Two times a day (BID) | ORAL | 0 refills | Status: DC

## 2020-06-27 NOTE — Plan of Care (Signed)
Plan of care reviewed and discussed with the patient. 

## 2020-06-27 NOTE — Discharge Summary (Signed)
Physician Discharge Summary  Patient ID: Lindsey Sanchez MRN: [id number] DOB/AGE: 51-10-1970 51 y.o.  Admit date: 06/25/2020 Discharge date: 06/27/2020  Admission Diagnoses: left hip osteoarthritis  Discharge Diagnoses:  Active Problems:   S/P total left hip arthroplasty   Discharged Condition: fair  Hospital Course: Patient underwent a L THA by Dr. Eulah Pont on 06/25/20 without any complications. She spent 2 nights in the hospital due to some issues with post-op nausea and difficulty mobilizing. Today she is feeling better and has passed her PT/OT evals and is ready to go home.   Consults: None  Significant Diagnostic Studies: none  Treatments: antibiotics: Ancef, analgesia: acetaminophen, Vicodin, Dilaudid, Morphine, and oxycodone, anticoagulation: ASA, and surgery: L THA  Discharge Exam: Blood pressure 130/88, pulse 75, temperature 98.7 F (37.1 C), temperature source Oral, resp. rate 17, height 6' (1.829 m), weight 105.7 kg, last menstrual period 04/14/2017, SpO2 99 %. General appearance: alert, cooperative, and no distress Head: Normocephalic, without obvious abnormality, atraumatic Eyes: conjunctivae/corneas clear. PERRL, EOM's intact. Fundi benign. Resp: clear to auscultation bilaterally Cardio: regular rate and rhythm, S1, S2 normal, no murmur, click, rub or gallop GI: soft, non-tender; bowel sounds normal; no masses,  no organomegaly Extremities: extremities normal, atraumatic, no cyanosis or edema Pulses:  L brachial 2+ R brachial 2+  L radial 2+ R radial 2+  L inguinal 2+ R inguinal 2+  L popliteal 2+ R popliteal 2+  L posterior tibial 2+ R posterior tibial 2+  L dorsalis pedis 2+ R dorsalis pedis 2+   Neurologic: Alert and oriented X 3, normal strength and tone. Normal symmetric reflexes. Normal coordination and gait Incision/Wound: c/d/i  Disposition: Discharge disposition: 01-Home or Self Care       Discharge Instructions     Call MD / Call 911   Complete  by: As directed    If you experience chest pain or shortness of breath, CALL 911 and be transported to the hospital emergency room.  If you develope a fever above 101 F, pus (white drainage) or increased drainage or redness at the wound, or calf pain, call your surgeon's office.   Diet - low sodium heart healthy   Complete by: As directed    Discharge instructions   Complete by: As directed    You may bear weight as tolerated. Keep your dressing on and dry until follow up. Take medicine to prevent blood clots as directed. Take pain medicine as needed with the goal of transitioning to over the counter medicines.    INSTRUCTIONS AFTER JOINT REPLACEMENT   Remove items at home which could result in a fall. This includes throw rugs or furniture in walking pathways ICE to the affected joint every three hours while awake for 30 minutes at a time, for at least the first 3-5 days, and then as needed for pain and swelling.  Continue to use ice for pain and swelling. You may notice swelling that will progress down to the foot and ankle.  This is normal after surgery.  Elevate your leg when you are not up walking on it.   Continue to use the breathing machine you got in the hospital (incentive spirometer) which will help keep your temperature down.  It is common for your temperature to cycle up and down following surgery, especially at night when you are not up moving around and exerting yourself.  The breathing machine keeps your lungs expanded and your temperature down.   DIET:  As you were doing prior to hospitalization, we  recommend a well-balanced diet.  DRESSING / WOUND CARE / SHOWERING  You may shower 3 days after surgery, but keep the wounds dry during showering.  You may use an occlusive plastic wrap (Press'n Seal for example) with blue painter's tape at edges, NO SOAKING/SUBMERGING IN THE BATHTUB.  If the bandage gets wet, call the office.  ACTIVITY  Increase activity slowly as tolerated,  but follow the weight bearing instructions below.   No driving for 6 weeks or until further direction given by your physician.  You cannot drive while taking narcotics.  No lifting or carrying greater than 10 lbs. until further directed by your surgeon. Avoid periods of inactivity such as sitting longer than an hour when not asleep. This helps prevent blood clots.  You may return to work once you are authorized by your doctor.    WEIGHT BEARING   Weight bearing as tolerated with assist device (walker, cane, etc) as directed, use it as long as suggested by your surgeon or therapist, typically at least 4-6 weeks.   EXERCISES  Results after joint replacement surgery are often greatly improved when you follow the exercise, range of motion and muscle strengthening exercises prescribed by your doctor. Safety measures are also important to protect the joint from further injury. Any time any of these exercises cause you to have increased pain or swelling, decrease what you are doing until you are comfortable again and then slowly increase them. If you have problems or questions, call your caregiver or physical therapist for advice.   Rehabilitation is important following a joint replacement. After just a few days of immobilization, the muscles of the leg can become weakened and shrink (atrophy).  These exercises are designed to build up the tone and strength of the thigh and leg muscles and to improve motion. Often times heat used for twenty to thirty minutes before working out will loosen up your tissues and help with improving the range of motion but do not use heat for the first two weeks following surgery (sometimes heat can increase post-operative swelling).   These exercises can be done on a training (exercise) mat, on the floor, on a table or on a bed. Use whatever works the best and is most comfortable for you.    Use music or television while you are exercising so that the exercises are a pleasant  break in your day. This will make your life better with the exercises acting as a break in your routine that you can look forward to.   Perform all exercises about fifteen times, three times per day or as directed.  You should exercise both the operative leg and the other leg as well.  Exercises include:   Quad Sets - Tighten up the muscle on the front of the thigh (Quad) and hold for 5-10 seconds.   Straight Leg Raises - With your knee straight (if you were given a brace, keep it on), lift the leg to 60 degrees, hold for 3 seconds, and slowly lower the leg.  Perform this exercise against resistance later as your leg gets stronger.  Leg Slides: Lying on your back, slowly slide your foot toward your buttocks, bending your knee up off the floor (only go as far as is comfortable). Then slowly slide your foot back down until your leg is flat on the floor again.  Angel Wings: Lying on your back spread your legs to the side as far apart as you can without causing discomfort.  Hamstring Strength:  Lying on your back, push your heel against the floor with your leg straight by tightening up the muscles of your buttocks.  Repeat, but this time bend your knee to a comfortable angle, and push your heel against the floor.  You may put a pillow under the heel to make it more comfortable if necessary.   A rehabilitation program following joint replacement surgery can speed recovery and prevent re-injury in the future due to weakened muscles. Contact your doctor or a physical therapist for more information on knee rehabilitation.    CONSTIPATION  Constipation is defined medically as fewer than three stools per week and severe constipation as less than one stool per week.  Even if you have a regular bowel pattern at home, your normal regimen is likely to be disrupted due to multiple reasons following surgery.  Combination of anesthesia, postoperative narcotics, change in appetite and fluid intake all can affect your  bowels.   YOU MUST use at least one of the following options; they are listed in order of increasing strength to get the job done.  They are all available over the counter, and you may need to use some, POSSIBLY even all of these options:    Drink plenty of fluids (prune juice may be helpful) and high fiber foods Colace 100 mg by mouth twice a day  Senokot for constipation as directed and as needed Dulcolax (bisacodyl), take with full glass of water  Miralax (polyethylene glycol) once or twice a day as needed.  If you have tried all these things and are unable to have a bowel movement in the first 3-4 days after surgery call either your surgeon or your primary doctor.    If you experience loose stools or diarrhea, hold the medications until you stool forms back up.  If your symptoms do not get better within 1 week or if they get worse, check with your doctor.  If you experience "the worst abdominal pain ever" or develop nausea or vomiting, please contact the office immediately for further recommendations for treatment.   ITCHING:  If you experience itching with your medications, try taking only a single pain pill, or even half a pain pill at a time.  You can also use Benadryl over the counter for itching or also to help with sleep.   TED HOSE STOCKINGS:  Use stockings on both legs until for at least 2 weeks or as directed by physician office. They may be removed at night for sleeping.  MEDICATIONS:  See your medication summary on the "After Visit Summary" that nursing will review with you.  You may have some home medications which will be placed on hold until you complete the course of blood thinner medication.  It is important for you to complete the blood thinner medication as prescribed.  Take medicines as prescribed.   You have several different medicines that work in different ways. - Tylenol is for mild to moderate pain. Try to take this medicine before turning to your narcotic medicines.   - Meloxicam is to reduce pain / inflammation - Robaxin is for muscle spasms. This medicine can make you drowsy. - Oxycodone is a narcotic pain medicine.  Take this for severe pain. This medicine can be dehydrating / constipating. - Zofran is for nausea and vomiting. - Omeprazole is for gastric protection while taking these medicines. - Aspirin is to prevent blood clots after surgery. YOU MUST TAKE THIS MEDICINE!!  PRECAUTIONS:  If you experience chest pain or  shortness of breath - call 911 immediately for transfer to the hospital emergency department.   If you develop a fever greater that 101 F, purulent drainage from wound, increased redness or drainage from wound, foul odor from the wound/dressing, or calf pain - CONTACT YOUR SURGEON.                                                   FOLLOW-UP APPOINTMENTS:  If you do not already have a post-op appointment, please call the office [phone] for an appointment to be seen by Dr. Eulah PontMurphy in 2 weeks.   OTHER INSTRUCTIONS:   MAKE SURE YOU:  Understand these instructions.  Get help right away if you are not doing well or get worse.    Thank you for letting us be a part of your medical care team.  It is a privilege we respect greatly.  We hope these instructions will help you stay on track for a fast and full recovery!   Post-operative opioid taper instructions:   Complete by: As directed    POST-OPERATIVE OPIOID TAPER INSTRUCTIONS: It is important to wean off of your opioid medication as soon as possible. If you do not need pain medication after your surgery it is ok to stop day one. Opioids include: Codeine, Hydrocodone(Norco, Vicodin), Oxycodone(Percocet, oxycontin) and hydromorphone amongst others.  Long term and even short term use of opiods can cause: Increased pain response Dependence Constipation Depression Respiratory depression And more.  Withdrawal symptoms can include Flu like symptoms Nausea, vomiting And  more Techniques to manage these symptoms Hydrate well Eat regular healthy meals Stay active Use relaxation techniques(deep breathing, meditating, yoga) Do Not substitute Alcohol to help with tapering If you have been on opioids for less than two weeks and do not have pain than it is ok to stop all together.  Plan to wean off of opioids This plan should start within one week post op of your joint replacement. Maintain the same interval or time between taking each dose and first decrease the dose.  Cut the total daily intake of opioids by one tablet each day Next start to increase the time between doses. The last dose that should be eliminated is the evening dose.      Weight bearing as tolerated   Complete by: As directed       Allergies as of 06/27/2020   No Known Allergies      Medication List     STOP taking these medications    Bengay Vanishing Scent 2.5 % Gel Generic drug: Menthol (Topical Analgesic)   ibuprofen 200 MG tablet Commonly known as: ADVIL       TAKE these medications    acetaminophen 500 MG tablet Commonly known as: TYLENOL Take 2 tablets (1,000 mg total) by mouth every 6 (six) hours as needed for mild pain or moderate pain. What changed:  when to take this reasons to take this   aspirin 81 MG chewable tablet Chew 1 tablet (81 mg total) by mouth 2 (two) times daily. To prevent blood clots after surgery   fluticasone 50 MCG/ACT nasal spray Commonly known as: FLONASE Place 1 spray into both nostrils daily as needed for allergies or rhinitis.   levocetirizine 5 MG tablet Commonly known as: XYZAL Take 5 mg by mouth every evening.   magnesium oxide 400 MG  tablet Commonly known as: MAG-OX Take 400 mg by mouth 2 (two) times a week.   meloxicam 15 MG tablet Commonly known as: MOBIC Take 1 tablet (15 mg total) by mouth daily as needed for pain (and inflammation).   methocarbamol 500 MG tablet Commonly known as: Robaxin Take 1 tablet (500 mg  total) by mouth every 8 (eight) hours as needed for muscle spasms.   montelukast 10 MG tablet Commonly known as: SINGULAIR Take 10 mg by mouth at bedtime.   multivitamin with minerals Tabs tablet Take 1 tablet by mouth daily.   omeprazole 20 MG tablet Commonly known as: PriLOSEC OTC Take 1 tablet (20 mg total) by mouth daily. For gastric protection   ondansetron 4 MG tablet Commonly known as: Zofran Take 1 tablet (4 mg total) by mouth daily as needed for nausea or vomiting.   oxyCODONE 5 MG immediate release tablet Commonly known as: Roxicodone Take 1 tablet (5 mg total) by mouth every 6 (six) hours as needed for severe pain. Do not take more than 6 tablets in a 24 hour period.               Discharge Care Instructions  (From admission, onward)           Start     Ordered   06/27/20 0000  Weight bearing as tolerated        06/27/20 1009            Follow-up Information     Sheral Apley, MD. Go in 2 week(s).   Specialty: Orthopedic Surgery Contact information: [address] [place] [address] [phone]                 Signed: Marzetta Board 06/27/2020, 10:09 AM

## 2020-06-27 NOTE — Progress Notes (Signed)
    Subjective: Patient reports pain as mild to moderate. Had issues with nausea and constipation thus far that has limioted her recovery. Urinating. No CP, SOB.  PT/OT mobilization sessions OOB have been limited due to her feeling sick.   Objective:   VITALS:   Vitals:   06/26/20 0451 06/26/20 1415 06/26/20 2129 06/27/20 0516  BP: 109/69 135/74 140/75 130/88  Pulse: 73 75 80 75  Resp: 17 18 17 17   Temp: 98.4 F (36.9 C) 98.1 F (36.7 C) 98 F (36.7 C) 98.7 F (37.1 C)  TempSrc: Oral  Oral Oral  SpO2: 98% 97% 99% 99%  Weight:      Height:       CBC Latest Ref Rng & Units 06/17/2020 08/30/2015 10/03/2009  WBC 4.0 - 10.5 K/uL 4.6 5.3 16.5(H)  Hemoglobin 12.0 - 15.0 g/dL 10/05/2009 10.2(L) 9.5(L)  Hematocrit 36.0 - 46.0 % 41.4 30.5(L) 27.7(L)  Platelets 150 - 400 K/uL 289 333 259 DELTA CHECK NOTED REPEATED TO VERIFY   BMP Latest Ref Rng & Units 06/17/2020  Glucose 70 - 99 mg/dL 08/17/2020)  BUN 6 - 20 mg/dL 7  Creatinine 062(I - 9.48 mg/dL 5.46  Sodium 2.70 - 350 mmol/L 136  Potassium 3.5 - 5.1 mmol/L 3.9  Chloride 98 - 111 mmol/L 103  CO2 22 - 32 mmol/L 26  Calcium 8.9 - 10.3 mg/dL 9.6   Intake/Output      06/15 0701 06/16 0700 06/16 0701 06/17 0700   P.O. 410    I.V. (mL/kg)     IV Piggyback     Total Intake(mL/kg) 410 (3.9)    Urine (mL/kg/hr)     Stool     Blood     Total Output     Net +410         Urine Occurrence 4 x    Stool Occurrence 1 x       Physical Exam: General: NAD. In the bathroom trying to have BM. Resp: No increased wob Cardio: regular rate and rhythm ABD soft Neurologically intact MSK Neurovascularly intact Sensation intact distally Intact pulses distally Dorsiflexion/Plantar flexion intact Incision: dressing C/D/I   Assessment: 2 Days Post-Op  S/P Procedure(s) (LRB): TOTAL HIP ARTHROPLASTY ANTERIOR APPROACH (Left) by Dr. 7/17. Jewel Baize on 06/25/20  Active Problems:   S/P total left hip arthroplasty   Plan: Better control of nausea  today Continue to give constipation meds Advance diet Up with therapy Incentive Spirometry Elevate and Apply ice  Weightbearing: WBAT LLE Insicional and dressing care: Dressings left intact until follow-up and Reinforce dressings as needed Orthopedic device(s): None Showering: Keep dressing dry VTE prophylaxis: Aspirin 81mg  BID  x 30 days post op , SCDs, ambulation Pain control: Continue current regimen Follow - up plan: 2 weeks Contact information:  06/27/20 MD, PA-C  Dispo: Home hopefully today if cleared by PT/OT. Patient eager to go home.     Margarita Rana, PA-C Office [phone] 06/27/2020, 8:42 AM

## 2020-06-27 NOTE — Progress Notes (Signed)
RN reviewed discharge instructions with patient and family. All questions answered.   Paperwork given. Prescriptions electronically sent to patient pharmacy.    NT rolled patient down with all belongings to family car.     Janyiah Silveri, RN  

## 2020-06-27 NOTE — Progress Notes (Signed)
Physical Therapy Treatment Patient Details Name: Lindsey Sanchez MRN: [id number] DOB: [birth year] Today's Date: 06/27/2020    History of Present Illness s/p L DA THA. PMH: pre-diabetes    PT Comments    Pt ambulates 76 ft then additional 20 ft with RW, step to pattern, good steadiness, VC for upright posture, knee flexion in swing and heel strike to promote heel-toe pattern. Pt ascends/descends stairs with RW, therapist lightly bracing RW and pt able to maintain sequencing. Provided written/illustrated HEP and educated pt on sets/reps with pt verbalizing understanding. All questions answered and education complete. Pt with good family support and ready to d/c home with family assist.   Follow Up Recommendations  Follow surgeon's recommendation for DC plan and follow-up therapies;Home health PT     Equipment Recommendations  Rolling walker with 5" wheels;3in1 (PT)    Recommendations for Other Services       Precautions / Restrictions Precautions Precautions: Fall Restrictions Weight Bearing Restrictions: No Other Position/Activity Restrictions: WBAT    Mobility  Bed Mobility Overal bed mobility: Needs Assistance Bed Mobility: Supine to Sit  Supine to sit: Supervision  General bed mobility comments: self assist with gait belt to mobilize LLE to EOB, increased time, supv for safety    Transfers Overall transfer level: Needs assistance Equipment used: Rolling walker (2 wheeled) Transfers: Sit to/from Stand Sit to Stand: Supervision  General transfer comment: powers to stand with BUE assisting and LLE extended, no VCs  Ambulation/Gait Ambulation/Gait assistance: Supervision Gait Distance (Feet): 76 Feet (+additional 20) Assistive device: Rolling walker (2 wheeled) Gait Pattern/deviations: Step-to pattern;Decreased stance time - left;Trunk flexed Gait velocity: decreased   General Gait Details: step to pattern, VCs for increasing knee flexion in swing, heel strike, and  upright posture with good improvement   Stairs Stairs: Yes Stairs assistance: Supervision Stair Management: Backwards;Forwards;With walker Number of Stairs: 6 General stair comments: pt ascends/descends 3 4-inch steps with RW, backwards ascending and forwards descending with therapist bracing RW lightly, able to maintain appropriate sequencing   Wheelchair Mobility    Modified Rankin (Stroke Patients Only)       Balance Overall balance assessment: Needs assistance Sitting-balance support: Feet supported Sitting balance-Leahy Scale: Good Sitting balance - Comments: seated EOB   Standing balance support: During functional activity;Bilateral upper extremity supported Standing balance-Leahy Scale: Poor Standing balance comment: reliant on UE support with ambulation       Cognition Arousal/Alertness: Awake/alert Behavior During Therapy: WFL for tasks assessed/performed Overall Cognitive Status: Within Functional Limits for tasks assessed         Exercises Total Joint Exercises Long Arc Quad: Seated;AROM;Strengthening;Both;10 reps    General Comments        Pertinent Vitals/Pain Pain Assessment: 0-10 Pain Score: 3  Pain Location: L hip pain Pain Descriptors / Indicators: Burning;Sore Pain Intervention(s): Limited activity within patient's tolerance;Monitored during session    Home Living                      Prior Function            PT Goals (current goals can now be found in the care plan section) Acute Rehab PT Goals Patient Stated Goal: less hip pain PT Goal Formulation: With patient Time For Goal Achievement: 07/02/20 Potential to Achieve Goals: Good Progress towards PT goals: Progressing toward goals    Frequency    7X/week      PT Plan Current plan remains appropriate    Co-evaluation  AM-PAC PT "6 Clicks" Mobility   Outcome Measure  Help needed turning from your back to your side while in a flat bed without  using bedrails?: A Little Help needed moving from lying on your back to sitting on the side of a flat bed without using bedrails?: A Little Help needed moving to and from a bed to a chair (including a wheelchair)?: A Little Help needed standing up from a chair using your arms (e.g., wheelchair or bedside chair)?: A Little Help needed to walk in hospital room?: A Little Help needed climbing 3-5 steps with a railing? : A Little 6 Click Score: 18    End of Session Equipment Utilized During Treatment: Gait belt Activity Tolerance: Patient tolerated treatment well Patient left: in chair;with call bell/phone within reach;with chair alarm set Nurse Communication: Mobility status PT Visit Diagnosis: Difficulty in walking, not elsewhere classified (R26.2)     Time: 6294-7654 PT Time Calculation (min) (ACUTE ONLY): 24 min  Charges:  $Gait Training: 8-22 mins $Therapeutic Exercise: 8-22 mins                      Tori Jantzen Pilger PT, DPT 06/27/20, 9:46 AM

## 2020-07-01 ENCOUNTER — Ambulatory Visit: Payer: Medicaid Other | Attending: Orthopedic Surgery

## 2020-07-01 ENCOUNTER — Ambulatory Visit: Payer: Medicaid Other | Admitting: Physical Therapy

## 2020-07-01 ENCOUNTER — Other Ambulatory Visit: Payer: Self-pay

## 2020-07-01 DIAGNOSIS — M25652 Stiffness of left hip, not elsewhere classified: Secondary | ICD-10-CM | POA: Insufficient documentation

## 2020-07-01 DIAGNOSIS — M25552 Pain in left hip: Secondary | ICD-10-CM | POA: Diagnosis not present

## 2020-07-01 DIAGNOSIS — M6281 Muscle weakness (generalized): Secondary | ICD-10-CM | POA: Diagnosis present

## 2020-07-01 DIAGNOSIS — R262 Difficulty in walking, not elsewhere classified: Secondary | ICD-10-CM

## 2020-07-02 NOTE — Therapy (Signed)
[facility] [address] [place], [place], [address] Phone: [phone]   Fax:  [phone]  Physical Therapy Evaluation  Patient Details  Name: Lindsey Sanchez MRN: [id number] Date of Birth: February 02, 1970 Referring Provider (PT): Sheral Apley, MD   Encounter Date: 07/01/2020   PT End of Session - 07/01/20 1605     Visit Number 1    Number of Visits 17    Date for PT Re-Evaluation 08/31/20    Authorization Type [employer]    Authorization Time Period pending auth    PT Start Time 1610    PT Stop Time 1650    PT Time Calculation (min) 40 min    Activity Tolerance Patient tolerated treatment well    Behavior During Therapy [facility] for tasks assessed/performed             Past Medical History:  Diagnosis Date   Medical history non-contributory    Pre-diabetes     Past Surgical History:  Procedure Laterality Date   ABDOMINAL HYSTERECTOMY     APPENDECTOMY     TOTAL HIP ARTHROPLASTY Left 06/25/2020   Procedure: TOTAL HIP ARTHROPLASTY ANTERIOR APPROACH;  Surgeon: Sheral Apley, MD;  Location: [facility];  Service: Orthopedics;  Laterality: Left;    There were no vitals filed for this visit.    Subjective Assessment - 07/01/20 1605     Subjective Patient is s/p Lt THA on 06/25/20 and it has been "tough." She was nauseous/dizzy post surgery, which kept her at the hospital longer. She is using RW for ambulation and occasionally going without walker when walking between rooms. She has been completing exercises prescribed by acute care. She reports the pain has not been too bad and is taking pain medication PRN.    How long can you sit comfortably? 40 minutes    How long can you stand comfortably? maybe 30 minutes    How long can you walk comfortably? maybe 20 minutes    Patient Stated Goals to be able to walk normally    Currently in Pain? Yes    Pain Score 5     Pain Location Hip    Pain Orientation Left;Anterior     Pain Descriptors / Indicators --   impacted   Pain Type Surgical pain    Pain Onset 1 to 4 weeks ago    Pain Frequency Intermittent    Aggravating Factors  sleep positioning    Pain Relieving Factors pain medication, ice                [facility] PT Assessment - 07/02/20 0001       Assessment   Medical Diagnosis Post-op L Total hip replacement 6/14    Referring Provider (PT) Sheral Apley, MD    Onset Date/Surgical Date 06/25/20    Hand Dominance Right    Next MD Visit unknown    Prior Therapy yes      Precautions   Precautions Anterior Hip    Precaution Booklet Issued No      Restrictions   Weight Bearing Restrictions Yes    LLE Weight Bearing Weight bearing as tolerated      Balance Screen   Has the patient fallen in the past 6 months No      Home Environment   Living Environment Private residence    Living Arrangements Children    Type of Home House    Additional Comments 2 steps into den  Prior Function   Level of Independence Needs assistance with homemaking    Vocation Requirements substitute teacher; 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      Cognition   Overall Cognitive Status Within Functional Limits for tasks assessed      Observation/Other Assessments   Skin Integrity not assessed; bandage in place    Focus on Therapeutic Outcomes (FOTO)  N/A MCD      AROM   Left Hip Extension --   lacking 20   Left Hip Flexion 60      PROM   Overall PROM Comments Lt hip flexion 90 degrees      Strength   Overall Strength Comments Left hip MMT deferred due to post-op acuity    Right Hip Flexion 4+/5    Right Knee Flexion 5/5    Right Knee Extension 5/5    Left Knee Flexion 4+/5    Left Knee Extension 4+/5      Ambulation/Gait   Ambulation/Gait Yes    Assistive device Rolling walker    Gait Pattern Step-through pattern;Decreased weight shift to left;Poor foot clearance - left      Standardized Balance Assessment   Standardized Balance  Assessment Timed Up and Go Test      Timed Up and Go Test   Normal TUG (seconds) 31                        Objective measurements completed on examination: See above findings.               PT Education - 07/02/20 0915     Education Details Education on current condition, signs/symptoms of infection and DVT, HEP.    Person(s) Educated Patient    Methods Explanation;Demonstration;Verbal cues;Handout    Comprehension Verbalized understanding;Returned demonstration;Verbal cues required              PT Short Term Goals - 07/01/20 1658       PT SHORT TERM GOAL #1   Title pt to be IND with inital HEP    Baseline issued at eval    Time 2    Period Weeks    Status New    Target Date 07/15/20      PT SHORT TERM GOAL #2   Title Patient will demonstrate at least 90 degrees of Lt hip flexion AROM to improve ability to complete sit <> stand transfer.    Baseline 60 degrees    Time 4    Period Weeks    Status New    Target Date 07/29/20      PT SHORT TERM GOAL #3   Title Patient will demonstrate neutral Lt hip extension AROM to improve stride.    Baseline lacking 20    Time 4    Period Weeks    Status New    Target Date 07/29/20      PT SHORT TERM GOAL #4   Title Patient will complete TUG in less than 20 seconds with LRAD to improve functional mobility and decrease fall risk.    Baseline 31 seconds    Time 4    Period Weeks    Status New    Target Date 07/29/20               PT Long Term Goals - 07/01/20 1700       PT LONG TERM GOAL #1   Title Patient will demonstrate normalized gait with LRAD.  Baseline antalgic gait with RW    Time 8    Period Weeks    Status New    Target Date 08/26/20      PT LONG TERM GOAL #2   Title Patient will demonstrate at least 4+/5 Lt hip strength to improve stability about the chain with prolonged walking/standing activity.    Baseline hip MMT deferred due to post-op acuity.    Time 8    Period  Weeks    Status New    Target Date 08/26/20      PT LONG TERM GOAL #3   Title Patient will self report tolerating at least 60 minutes of sitting and standing activity to improve her tolerance to work specific activities.    Baseline see subjective    Time 8    Period Weeks    Status New    Target Date 08/26/20      PT LONG TERM GOAL #4   Title --    Baseline --    Time --    Period --    Status --    Target Date --                    Plan - 07/01/20 1709     Clinical Impression Statement Patient is a 50 y/o female s/p Lt THA anterior approach on 06/25/20. She reports her pain is pretty well managed currently and she has been WBAT with RW, though occasionally comleting household ambulation without assistive device. She has strength, ROM, gait and balance deficits that are consistent with her recent post-op status. She will benefit from skilled PT to restore her ROM and strength in order to return to PLOF and maximize her independence with ADLs.    Personal Factors and Comorbidities Time since onset of injury/illness/exacerbation    Examination-Activity Limitations Bed Mobility;Bathing;Transfers;Sit;Sleep;Squat;Stairs;Stand;Lift;Bend    Examination-Participation Restrictions Meal Prep;Occupation;Driving    Stability/Clinical Decision Making Stable/Uncomplicated    Clinical Decision Making Low    Rehab Potential Good    PT Frequency 2x / week    PT Duration 8 weeks    PT Treatment/Interventions ADLs/Self Care Home Management;Cryotherapy;Moist Heat;Gait training;Therapeutic exercise;Therapeutic activities;Neuromuscular re-education;Manual techniques;Passive range of motion;Patient/family education;Taping;Stair training;Balance training;Dry needling    PT Next Visit Plan review HEP, manual to Lt hip, hip ROM, gait training    PT Home Exercise Plan Access Code: [email or web address]    Consulted and Agree with Plan of Care Patient             Patient will benefit from skilled  therapeutic intervention in order to improve the following deficits and impairments:  Abnormal gait, Improper body mechanics, Pain, Decreased activity tolerance, Decreased endurance, Decreased range of motion, Difficulty walking, Decreased balance, Decreased strength  Visit Diagnosis: Pain in left hip  Stiffness of left hip, not elsewhere classified  Muscle weakness (generalized)  Difficulty in walking, not elsewhere classified     Problem List Patient Active Problem List   Diagnosis Date Noted   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 [place], [place], [address] Phone: [phone]   Fax:  [phone]  Name: Lindsey Sanchez MRN: [id number] Date of Birth:  06/30/1970   

## 2020-07-03 ENCOUNTER — Ambulatory Visit: Payer: Medicaid Other | Admitting: Physical Therapy

## 2020-07-03 ENCOUNTER — Ambulatory Visit: Payer: Medicaid Other

## 2020-07-09 ENCOUNTER — Ambulatory Visit: Payer: Medicaid Other

## 2020-07-09 ENCOUNTER — Ambulatory Visit: Payer: Medicaid Other | Admitting: Physical Therapy

## 2020-07-09 ENCOUNTER — Other Ambulatory Visit: Payer: Self-pay

## 2020-07-09 DIAGNOSIS — M25652 Stiffness of left hip, not elsewhere classified: Secondary | ICD-10-CM

## 2020-07-09 DIAGNOSIS — M25552 Pain in left hip: Secondary | ICD-10-CM

## 2020-07-09 DIAGNOSIS — M6281 Muscle weakness (generalized): Secondary | ICD-10-CM

## 2020-07-09 DIAGNOSIS — R262 Difficulty in walking, not elsewhere classified: Secondary | ICD-10-CM

## 2020-07-09 NOTE — Therapy (Signed)
[facility] [address] [place], [place], [address] Phone: [phone]   Fax:  [phone]  Physical Therapy Treatment  Patient Details  Name: Lindsey Sanchez MRN: [id number] Date of Birth: [birth year] Referring Provider (PT): Sheral Apley, MD   Encounter Date: 07/09/2020   PT End of Session - 07/09/20 1659     Visit Number 2    Number of Visits 17    Date for PT Re-Evaluation 08/31/20    Authorization Type [employer] M[employer]    Authorization Time Period 6/22-8/20/22    Authorization - Visit Number 1    Authorization - Number of Visits 16    PT Start Time 1700    PT Stop Time 1742    PT Time Calculation (min) 42 min    Activity Tolerance Patient tolerated treatment well    Behavior During Therapy [facility] for tasks assessed/performed             Past Medical History:  Diagnosis Date   Medical history non-contributory    Pre-diabetes     Past Surgical History:  Procedure Laterality Date   ABDOMINAL HYSTERECTOMY     APPENDECTOMY     TOTAL HIP ARTHROPLASTY Left 06/25/2020   Procedure: TOTAL HIP ARTHROPLASTY ANTERIOR APPROACH;  Surgeon: Sheral Apley, MD;  Location: [facility];  Service: Orthopedics;  Laterality: Left;    There were no vitals filed for this visit.   Subjective Assessment - 07/09/20 1700     Subjective Patient reports she is doing ok. She has been working on her exercises. She has f/u with surgeon tomorrow. She is using SPC intermittently.    How long can you sit comfortably? 40 minutes    How long can you stand comfortably? maybe 30 minutes    How long can you walk comfortably? maybe 20 minutes    Patient Stated Goals to be able to walk normally    Currently in Pain? Yes    Pain Score 6     Pain Location Hip    Pain Orientation Left;Posterior    Pain Descriptors / Indicators Sore    Pain Type Surgical pain    Pain Onset 1 to 4 weeks ago                [facility] PT Assessment - 07/09/20 0001        AROM   Left Hip Extension --   lacking 10                          [facility] Adult PT Treatment/Exercise - 07/09/20 0001       Ambulation/Gait   Ambulation/Gait Yes    Pre-Gait Activities forward step over (LLE leading) 2 x 10    Gait Comments gait training in clinic focusing on equalizing step length, with and without [facility]      Self-Care   Other Self-Care Comments  see patient education      Knee/Hip Exercises: Stretches   Other Knee/Hip Stretches Heel slides 1 x 10 Lt      Knee/Hip Exercises: Seated   Long Arc Quad 10 reps    Long Arc Quad Weight 5 lbs.    Long [employer] Limitations x2; left    Sit to [employer] 5 reps;2 sets   raised height     Knee/Hip Exercises: Supine   Other Supine Knee/Hip Exercises supine march 2 x 10 bilateral      Manual Therapy  Manual therapy comments hip flexion and abduction PROM to tolerance                    PT Education - 07/09/20 1722     Education Details Updated HEP.    Person(s) Educated Patient    Methods Explanation;Verbal cues;Tactile cues;Handout    Comprehension Verbalized understanding;Returned demonstration;Verbal cues required              PT Short Term Goals - 07/01/20 1658       PT SHORT TERM GOAL #1   Title pt to be IND with inital HEP    Baseline issued at eval    Time 2    Period Weeks    Status New    Target Date 07/15/20      PT SHORT TERM GOAL #2   Title Patient will demonstrate at least 90 degrees of Lt hip flexion AROM to improve ability to complete sit <> stand transfer.    Baseline 60 degrees    Time 4    Period Weeks    Status New    Target Date 07/29/20      PT SHORT TERM GOAL #3   Title Patient will demonstrate neutral Lt hip extension AROM to improve stride.    Baseline lacking 20    Time 4    Period Weeks    Status New    Target Date 07/29/20      PT SHORT TERM GOAL #4   Title Patient will complete TUG in less than 20 seconds with LRAD to improve  functional mobility and decrease fall risk.    Baseline 31 seconds    Time 4    Period Weeks    Status New    Target Date 07/29/20               PT Long Term Goals - 07/01/20 1700       PT LONG TERM GOAL #1   Title Patient will demonstrate normalized gait with LRAD.    Baseline antalgic gait with RW    Time 8    Period Weeks    Status New    Target Date 08/26/20      PT LONG TERM GOAL #2   Title Patient will demonstrate at least 4+/5 Lt hip strength to improve stability about the chain with prolonged walking/standing activity.    Baseline hip MMT deferred due to post-op acuity.    Time 8    Period Weeks    Status New    Target Date 08/26/20      PT LONG TERM GOAL #3   Title Patient will self report tolerating at least 60 minutes of sitting and standing activity to improve her tolerance to work specific activities.    Baseline see subjective    Time 8    Period Weeks    Status New    Target Date 08/26/20      PT LONG TERM GOAL #4   Title --    Baseline --    Time --    Period --    Status --    Target Date --                   Plan - 07/09/20 1703     Clinical Impression Statement Patient demonstrates improved hip extension compared to initial evaluation, lacking 10 degrees today. Able to introduce hip strengthening without increased pain, though patient quickly fatigues. Cueing required for proper  sequence of sit <>stand and to control descent with patient able to properly perform at raised height.  Began gait trianing without assistive device focusing on improving step length and allowing for heel strike at initial contact. She tolerated session well today without increased pain reported.    Personal Factors and Comorbidities Time since onset of injury/illness/exacerbation    Examination-Activity Limitations Bed Mobility;Bathing;Transfers;Sit;Sleep;Squat;Stairs;Stand;Lift;Bend    Examination-Participation Restrictions Meal Prep;Occupation;Driving     Stability/Clinical Decision Making Stable/Uncomplicated    Rehab Potential Good    PT Frequency 2x / week    PT Duration 8 weeks    PT Treatment/Interventions ADLs/Self Care Home Management;Cryotherapy;Moist Heat;Gait training;Therapeutic exercise;Therapeutic activities;Neuromuscular re-education;Manual techniques;Passive range of motion;Patient/family education;Taping;Stair training;Balance training;Dry needling    PT Next Visit Plan review HEP, manual to Lt hip, hip ROM, gait training    PT Home Exercise Plan Access Code: [email or web address]    Consulted and Agree with Plan of Care Patient             Patient will benefit from skilled therapeutic intervention in order to improve the following deficits and impairments:  Abnormal gait, Improper body mechanics, Pain, Decreased activity tolerance, Decreased endurance, Decreased range of motion, Difficulty walking, Decreased balance, Decreased strength  Visit Diagnosis: Pain in left hip  Stiffness of left hip, not elsewhere classified  Muscle weakness (generalized)  Difficulty in walking, not elsewhere classified     Problem List Patient Active Problem List   Diagnosis Date Noted   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

## 2020-07-11 ENCOUNTER — Encounter: Payer: Self-pay | Admitting: Physical Therapy

## 2020-07-11 ENCOUNTER — Ambulatory Visit: Payer: Medicaid Other | Admitting: Physical Therapy

## 2020-07-11 ENCOUNTER — Other Ambulatory Visit: Payer: Self-pay

## 2020-07-11 DIAGNOSIS — M25552 Pain in left hip: Secondary | ICD-10-CM

## 2020-07-11 DIAGNOSIS — R262 Difficulty in walking, not elsewhere classified: Secondary | ICD-10-CM

## 2020-07-11 DIAGNOSIS — M6281 Muscle weakness (generalized): Secondary | ICD-10-CM

## 2020-07-11 DIAGNOSIS — M25652 Stiffness of left hip, not elsewhere classified: Secondary | ICD-10-CM

## 2020-07-11 NOTE — Therapy (Signed)
[facility] [address] [place], [place], [address] Phone: [phone]   Fax:  [phone]  Physical Therapy Treatment  Patient Details  Name: Lindsey Sanchez MRN: [id number] Date of Birth: [birth year] Referring Provider (PT): Sheral Apley, MD   Encounter Date: 07/11/2020   PT End of Session - 07/11/20 1748     Visit Number 3    Number of Visits 17    Date for PT Re-Evaluation 08/31/20    Authorization Type [employer] M[employer]    Authorization Time Period 6/22-8/20/22    Authorization - Visit Number 2    Authorization - Number of Visits 16    PT Start Time 0705    PT Stop Time 0743    PT Time Calculation (min) 38 min    Activity Tolerance Patient tolerated treatment well;No increased pain    Behavior During Therapy [facility] for tasks assessed/performed             Past Medical History:  Diagnosis Date   Medical history non-contributory    Pre-diabetes     Past Surgical History:  Procedure Laterality Date   ABDOMINAL HYSTERECTOMY     APPENDECTOMY     TOTAL HIP ARTHROPLASTY Left 06/25/2020   Procedure: TOTAL HIP ARTHROPLASTY ANTERIOR APPROACH;  Surgeon: Sheral Apley, MD;  Location: [facility];  Service: Orthopedics;  Laterality: Left;    There were no vitals filed for this visit.   Subjective Assessment - 07/11/20 1712     Subjective Patient reports she has been moderately compliant with HEP. Has not been using an assistive device, began short community ambulation for errands.    Patient Stated Goals to be able to walk normally    Currently in Pain? Yes    Pain Score --   Max= 0/10 last few days.   Pain Location Hip    Pain Orientation Left                [facility] PT Assessment - 07/11/20 0001       Assessment   Medical Diagnosis Post-op L Total hip replacement 6/14    Referring Provider (PT) Sheral Apley, MD    Onset Date/Surgical Date 06/25/20    Next MD Visit 07/10/2020      Precautions    Precautions Anterior Hip      Restrictions   Weight Bearing Restrictions Yes    LLE Weight Bearing Weight bearing as tolerated                           [facility] Adult PT Treatment/Exercise - 07/11/20 0001       Knee/Hip Exercises: Stretches   Other Knee/Hip Stretches Heel slides flex then abd; on slide board 1 x 10 Lt      Knee/Hip Exercises: Aerobic   Nustep L8x63min B UE/LE seat = 12, UE=10      Knee/Hip Exercises: Standing   Heel Raises 20 reps    Knee Flexion 2 sets;15 reps;Left;Right    Abduction Limitations Lateral stepping in bars 12' x2 ea dir      Knee/Hip Exercises: Seated   Sit to [employer] 5 reps;2 sets      Knee/Hip Exercises: Supine   Quad Sets 10 reps;2 sets    [employer] Limitations Glute sets 2x10 2sec hold    Bridges 5 reps    Bridges Limitations Initiate, no clearance    Other Supine Knee/Hip Exercises supine march 2 x 10  bilateral    Other Supine Knee/Hip Exercises hooklying hip add squeeze x20      Manual Therapy   Manual Therapy Soft tissue mobilization    Manual therapy comments hip flexion and abduction PROM to tolerance    Soft tissue mobilization ITB/TFL with palpable tender points                    PT Education - 07/11/20 1754     Education Details Encouraged to increase compliance with HEP.    Person(s) Educated Patient    Methods Explanation;Verbal cues;Demonstration    Comprehension Verbalized understanding;Returned demonstration              PT Short Term Goals - 07/01/20 1658       PT SHORT TERM GOAL #1   Title pt to be IND with inital HEP    Baseline issued at eval    Time 2    Period Weeks    Status New    Target Date 07/15/20      PT SHORT TERM GOAL #2   Title Patient will demonstrate at least 90 degrees of Lt hip flexion AROM to improve ability to complete sit <> stand transfer.    Baseline 60 degrees    Time 4    Period Weeks    Status New    Target Date 07/29/20      PT SHORT TERM GOAL #3    Title Patient will demonstrate neutral Lt hip extension AROM to improve stride.    Baseline lacking 20    Time 4    Period Weeks    Status New    Target Date 07/29/20      PT SHORT TERM GOAL #4   Title Patient will complete TUG in less than 20 seconds with LRAD to improve functional mobility and decrease fall risk.    Baseline 31 seconds    Time 4    Period Weeks    Status New    Target Date 07/29/20               PT Long Term Goals - 07/01/20 1700       PT LONG TERM GOAL #1   Title Patient will demonstrate normalized gait with LRAD.    Baseline antalgic gait with RW    Time 8    Period Weeks    Status New    Target Date 08/26/20      PT LONG TERM GOAL #2   Title Patient will demonstrate at least 4+/5 Lt hip strength to improve stability about the chain with prolonged walking/standing activity.    Baseline hip MMT deferred due to post-op acuity.    Time 8    Period Weeks    Status New    Target Date 08/26/20      PT LONG TERM GOAL #3   Title Patient will self report tolerating at least 60 minutes of sitting and standing activity to improve her tolerance to work specific activities.    Baseline see subjective    Time 8    Period Weeks    Status New    Target Date 08/26/20      PT LONG TERM GOAL #4   Title --    Baseline --    Time --    Period --    Status --    Target Date --  Plan - 07/11/20 1749     Clinical Impression Statement Patient tolerated increased standing activity and introduction of NuStep x72min requesting inecreased resistance.  Standing activity included weight bearing on L with only light support.  Patient continues to ambulate with significant antalgic pattern, would benefit from focused gait training.  Patient will benefit from continued skilled PT to address deficits and maximize return to safe functional mobility with decreased pain and normalize gait pattern.    Examination-Activity Limitations Bed  Mobility;Bathing;Transfers;Sit;Sleep;Squat;Stairs;Stand;Lift;Bend    PT Treatment/Interventions ADLs/Self Care Home Management;Cryotherapy;Moist Heat;Gait training;Therapeutic exercise;Therapeutic activities;Neuromuscular re-education;Manual techniques;Passive range of motion;Patient/family education;Taping;Stair training;Balance training;Dry needling    PT Next Visit Plan Review HEP and progress as appropriate, manual to Left hip, hip ROM/strength, FOCUSED gait training for improved pattern.    PT Home Exercise Plan Access Code: [email or web address]    Consulted and Agree with Plan of Care Patient             Patient will benefit from skilled therapeutic intervention in order to improve the following deficits and impairments:  Abnormal gait, Improper body mechanics, Pain, Decreased activity tolerance, Decreased endurance, Decreased range of motion, Difficulty walking, Decreased balance, Decreased strength  Visit Diagnosis: Pain in left hip  Stiffness of left hip, not elsewhere classified  Muscle weakness (generalized)  Difficulty in walking, not elsewhere classified     Problem List Patient Active Problem List   Diagnosis Date Noted   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 [place], [place], [address] Phone: [phone]   Fax:  [phone]  Name: Corena Tilson MRN: [id number] Date of Birth: [birth year]

## 2020-07-17 ENCOUNTER — Other Ambulatory Visit: Payer: Self-pay

## 2020-07-17 ENCOUNTER — Ambulatory Visit: Payer: Medicaid Other | Attending: Orthopedic Surgery

## 2020-07-17 DIAGNOSIS — M6281 Muscle weakness (generalized): Secondary | ICD-10-CM | POA: Insufficient documentation

## 2020-07-17 DIAGNOSIS — R262 Difficulty in walking, not elsewhere classified: Secondary | ICD-10-CM | POA: Insufficient documentation

## 2020-07-17 DIAGNOSIS — M25552 Pain in left hip: Secondary | ICD-10-CM | POA: Insufficient documentation

## 2020-07-17 DIAGNOSIS — M25652 Stiffness of left hip, not elsewhere classified: Secondary | ICD-10-CM | POA: Diagnosis present

## 2020-07-17 NOTE — Therapy (Signed)
[facility] [address] [place], [place], [address] Phone: [phone]   Fax:  [phone]  Physical Therapy Treatment  Patient Details  Name: Lindsey Sanchez MRN: [id number] Date of Birth: 01/29/1970 Referring Provider (PT): Murphy, Timothy D, MD   Encounter Date: 07/17/2020   PT End of Session - 07/17/20 1700     Visit Number 4    Number of Visits 17    Date for PT Re-Evaluation 08/31/20    Authorization Type [employer]    Authorization Time Period 6/22-8/20/22    Authorization - Visit Number 3    Authorization - Number of Visits 16    PT Start Time 1700    PT Stop Time 1742    PT Time Calculation (min) 42 min    Activity Tolerance Patient tolerated treatment well    Behavior During Therapy WFL for tasks assessed/performed             Past Medical History:  Diagnosis Date   Medical history non-contributory    Pre-diabetes     Past Surgical History:  Procedure Laterality Date   ABDOMINAL HYSTERECTOMY     APPENDECTOMY     TOTAL HIP ARTHROPLASTY Left 06/25/2020   Procedure: TOTAL HIP ARTHROPLASTY ANTERIOR APPROACH;  Surgeon: Murphy, Timothy D, MD;  Location: [facility];  Service: Orthopedics;  Laterality: Left;    There were no vitals filed for this visit.   Subjective Assessment - 07/17/20 1702     Subjective Patient reports she is doing good currently. Mostly just having night pain in the hip. She is not using AD anymore.    Patient Stated Goals to be able to walk normally    Currently in Pain? No/denies                [facility] PT Assessment - 07/17/20 0001       AROM   Left Hip Extension 0                           [facility] Adult PT Treatment/Exercise - 07/17/20 0001       Ambulation/Gait   Ambulation/Gait Yes    Ambulation Distance (Feet) --   2x 25 ft   Pre-Gait Activities forward step overs hurdle 2 x 10 bilateral    Gait Comments gait trianing focusing on maintaining  neutral lumbopelvic alignment, heel strike and push-off      Self-Care   Other Self-Care Comments  see patient education      Knee/Hip Exercises: Standing   Heel Raises Limitations 2x10; SL bilaterally    SLS 3 trials on LLE approx 8 sec each trial    Other Standing Knee Exercises step taps 8 inch step 2 x 10    Other Standing Knee Exercises --      Knee/Hip Exercises: Supine   Straight Leg Raises 10 reps    Straight Leg Raises Limitations partial range; x2 LLE    Other Supine Knee/Hip Exercises posterior pelvic tilts 1 x 10; 5 sec hold      Manual Therapy   Manual therapy comments hip flexion and abduction PROM to tolerance                    PT Education - 07/17/20 1745     Education Details Updated HEP.    Person(s) Educated Patient    Methods Explanation;Demonstration;Verbal cues;Tactile cues;Handout    Comprehension Verbalized understanding;Returned demonstration;Verbal cues required                PT Short Term Goals - 07/17/20 1718       PT SHORT TERM GOAL #1   Title pt to be IND with inital HEP    Time 2    Period Weeks    Status Achieved    Target Date 07/15/20      PT SHORT TERM GOAL #2   Title Patient will demonstrate at least 90 degrees of Lt hip flexion AROM to improve ability to complete sit <> stand transfer.    Baseline 60 degrees    Time 4    Period Weeks    Status Deferred    Target Date 07/29/20      PT SHORT TERM GOAL #3   Title Patient will demonstrate neutral Lt hip extension AROM to improve stride.    Baseline see flowsheet    Time 4    Period Weeks    Status Achieved    Target Date 07/29/20      PT SHORT TERM GOAL #4   Title Patient will complete TUG in less than 20 seconds with LRAD to improve functional mobility and decrease fall risk.    Baseline 31 seconds    Time 4    Period Weeks    Status Deferred    Target Date 07/29/20               PT Long Term Goals - 07/01/20 1700       PT LONG TERM GOAL #1    Title Patient will demonstrate normalized gait with LRAD.    Baseline antalgic gait with RW    Time 8    Period Weeks    Status New    Target Date 08/26/20      PT LONG TERM GOAL #2   Title Patient will demonstrate at least 4+/5 Lt hip strength to improve stability about the chain with prolonged walking/standing activity.    Baseline hip MMT deferred due to post-op acuity.    Time 8    Period Weeks    Status New    Target Date 08/26/20      PT LONG TERM GOAL #3   Title Patient will self report tolerating at least 60 minutes of sitting and standing activity to improve her tolerance to work specific activities.    Baseline see subjective    Time 8    Period Weeks    Status New    Target Date 08/26/20      PT LONG TERM GOAL #4   Title --    Baseline --    Time --    Period --    Status --    Target Date --                   Plan - 07/17/20 1719     Clinical Impression Statement Patient tolerated session well today without reports of pain. She demonstrates neutral Lt hip extension, having met this short term functional goal. She is no longer using an AD, though her gait remains antalgic. Focused on improving gait mechanics with patient requiring consistent cues to avoid excessive lateral trunk flexion and pelvic drop during step taps and forward step overs with ability to properly perform with continued reps. Good carryover of maintaining appropriate lumbopelvic alignment with gait training in clinic, though due to her limited Lt hip AROM her gait remains antalgic.    Examination-Activity Limitations Bed Mobility;Bathing;Transfers;Sit;Sleep;Squat;Stairs;Stand;Lift;Bend    PT Treatment/Interventions ADLs/Self Care Home Management;Cryotherapy;Moist Heat;Gait training;Therapeutic exercise;Therapeutic activities;Neuromuscular re-education;Manual  techniques;Passive range of motion;Patient/family education;Taping;Stair training;Balance training;Dry needling    PT Next Visit Plan  consider recumbent bike,  manual to Left hip, hip ROM/strength, FOCUSED gait training for improved pattern.    PT Home Exercise Plan Access Code: [email or web address]    [email or web address] and Agree with Plan of Care Patient             Patient will benefit from skilled therapeutic intervention in order to improve the following deficits and impairments:  Abnormal gait, Improper body mechanics, Pain, Decreased activity tolerance, Decreased endurance, Decreased range of motion, Difficulty walking, Decreased balance, Decreased strength  Visit Diagnosis: Pain in left hip  Stiffness of left hip, not elsewhere classified  Muscle weakness (generalized)  Difficulty in walking, not elsewhere classified     Problem List Patient Active Problem List   Diagnosis Date Noted   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

## 2020-07-23 ENCOUNTER — Ambulatory Visit: Payer: Medicaid Other

## 2020-07-25 ENCOUNTER — Other Ambulatory Visit: Payer: Self-pay

## 2020-07-25 ENCOUNTER — Ambulatory Visit: Payer: Medicaid Other

## 2020-07-25 DIAGNOSIS — M25552 Pain in left hip: Secondary | ICD-10-CM | POA: Diagnosis not present

## 2020-07-25 DIAGNOSIS — M6281 Muscle weakness (generalized): Secondary | ICD-10-CM

## 2020-07-25 DIAGNOSIS — M25652 Stiffness of left hip, not elsewhere classified: Secondary | ICD-10-CM

## 2020-07-25 DIAGNOSIS — R262 Difficulty in walking, not elsewhere classified: Secondary | ICD-10-CM

## 2020-07-25 NOTE — Therapy (Signed)
[place], [place], [address] Phone: [phone]   Fax:  [phone]  Physical Therapy Treatment  Patient Details  Name: Lindsey Sanchez MRN: [id number] Date of Birth: 22-Nov-1970 Referring Provider (PT): Renette Butters, MD   Encounter Date: 07/25/2020   PT End of Session - 07/25/20 1541     Visit Number 5    Number of Visits 17    Date for PT Re-Evaluation 08/31/20    Authorization Type [employer]    Authorization Time Period 6/22-8/20/22    Authorization - Visit Number 4    Authorization - Number of Visits 16    PT Start Time 8453   patient late   PT Stop Time 6468    PT Time Calculation (min) 33 min    Activity Tolerance Patient tolerated treatment well    Behavior During Therapy [facility] for tasks assessed/performed             Past Medical History:  Diagnosis Date   Medical history non-contributory    Pre-diabetes     Past Surgical History:  Procedure Laterality Date   ABDOMINAL HYSTERECTOMY     APPENDECTOMY     TOTAL HIP ARTHROPLASTY Left 06/25/2020   Procedure: TOTAL HIP ARTHROPLASTY ANTERIOR APPROACH;  Surgeon: Renette Butters, MD;  Location: [facility];  Service: Orthopedics;  Laterality: Left;    There were no vitals filed for this visit.   Subjective Assessment - 07/25/20 1541     Subjective Patient reports she is doing alright. She reports compliance with HEP.    Patient Stated Goals to be able to walk normally    Currently in Pain? No/denies                [facility] PT Assessment - 07/25/20 0001       Standardized Balance Assessment   Standardized Balance Assessment Timed Up and Go Test      Timed Up and Go Test   Normal TUG (seconds) 8                           [facility] Adult PT Treatment/Exercise - 07/25/20 0001       Knee/Hip Exercises: Stretches   Sports administrator 60 seconds    Quad Stretch Limitations bilateral      Knee/Hip Exercises:  Aerobic   Recumbent Bike 5 minutes level 5      Knee/Hip Exercises: Machines for Strengthening   Total Gym Leg Press 2 x 10 @ 60 lbs      Manual Therapy   Manual therapy comments hip flexion and abduction PROM to tolerance    Soft tissue mobilization scar tissue mobilization                      PT Short Term Goals - 07/25/20 1544       PT SHORT TERM GOAL #1   Title pt to be IND with inital HEP    Time 2    Period Weeks    Status Achieved    Target Date 07/15/20      PT SHORT TERM GOAL #2   Title Patient will demonstrate at least 90 degrees of Lt hip flexion AROM to improve ability to complete sit <> stand transfer.    Baseline 60 degrees    Time 4    Period Weeks    Status Deferred    Target Date 07/29/20  PT SHORT TERM GOAL #3   Title Patient will demonstrate neutral Lt hip extension AROM to improve stride.    Baseline see flowsheet    Time 4    Period Weeks    Status Achieved    Target Date 07/29/20      PT SHORT TERM GOAL #4   Title Patient will complete TUG in less than 20 seconds with LRAD to improve functional mobility and decrease fall risk.    Baseline 8 seconds    Time 4    Period Weeks    Status Achieved    Target Date 07/29/20               PT Long Term Goals - 07/01/20 1700       PT LONG TERM GOAL #1   Title Patient will demonstrate normalized gait with LRAD.    Baseline antalgic gait with RW    Time 8    Period Weeks    Status New    Target Date 08/26/20      PT LONG TERM GOAL #2   Title Patient will demonstrate at least 4+/5 Lt hip strength to improve stability about the chain with prolonged walking/standing activity.    Baseline hip MMT deferred due to post-op acuity.    Time 8    Period Weeks    Status New    Target Date 08/26/20      PT LONG TERM GOAL #3   Title Patient will self report tolerating at least 60 minutes of sitting and standing activity to improve her tolerance to work specific activities.     Baseline see subjective    Time 8    Period Weeks    Status New    Target Date 08/26/20      PT LONG TERM GOAL #4   Title --    Baseline --    Time --    Period --    Status --    Target Date --                   Plan - 07/25/20 1545     Clinical Impression Statement Session was somewhat limited as patient was late for appointment. Patient tolerated session well today without reports of pain. Her TUG score has significantly improved compared to baseline having met this short term functional goal. Able to progress CKC strengthening with patient demonstrating good form. Able to begin scar tissue mobilization with significant tautness present along distal and proximal aspect of scar.    Examination-Activity Limitations Bed Mobility;Bathing;Transfers;Sit;Sleep;Squat;Stairs;Stand;Lift;Bend    PT Treatment/Interventions ADLs/Self Care Home Management;Cryotherapy;Moist Heat;Gait training;Therapeutic exercise;Therapeutic activities;Neuromuscular re-education;Manual techniques;Passive range of motion;Patient/family education;Taping;Stair training;Balance training;Dry needling    PT Next Visit Plan update HEP  manual to Left hip, hip ROM/strength, FOCUSED gait training for improved pattern.    PT Home Exercise Plan Access Code: [email or web address]    Consulted and Agree with Plan of Care Patient             Patient will benefit from skilled therapeutic intervention in order to improve the following deficits and impairments:  Abnormal gait, Improper body mechanics, Pain, Decreased activity tolerance, Decreased endurance, Decreased range of motion, Difficulty walking, Decreased balance, Decreased strength  Visit Diagnosis: Pain in left hip  Stiffness of left hip, not elsewhere classified  Muscle weakness (generalized)  Difficulty in walking, not elsewhere classified     Problem List Patient Active Problem List   Diagnosis Date Noted   S/P  total left hip arthroplasty 06/25/2020    Trichimoniasis 05/04/2017   Gwendolyn Grant, PT, DPT, ATC 07/25/20 4:40 PM   [facility] [address] [place], [place], [address] Phone: [phone]   Fax:  [phone]  Name: Karlynn Furrow MRN: [id number] Date of Birth: 1970/10/24

## 2020-07-30 ENCOUNTER — Ambulatory Visit: Payer: Medicaid Other

## 2020-08-06 ENCOUNTER — Ambulatory Visit: Payer: Medicaid Other

## 2020-08-06 ENCOUNTER — Telehealth: Payer: Self-pay

## 2020-08-06 NOTE — Telephone Encounter (Signed)
Spoke with patient regarding missed PT appointment. She forgot about scheduled visit. Confirmed next appointment.

## 2020-08-08 ENCOUNTER — Ambulatory Visit: Payer: Medicaid Other

## 2020-08-08 ENCOUNTER — Other Ambulatory Visit: Payer: Self-pay

## 2020-08-08 DIAGNOSIS — M25552 Pain in left hip: Secondary | ICD-10-CM | POA: Diagnosis not present

## 2020-08-08 DIAGNOSIS — M6281 Muscle weakness (generalized): Secondary | ICD-10-CM

## 2020-08-08 DIAGNOSIS — R262 Difficulty in walking, not elsewhere classified: Secondary | ICD-10-CM

## 2020-08-08 DIAGNOSIS — M25652 Stiffness of left hip, not elsewhere classified: Secondary | ICD-10-CM

## 2020-08-08 NOTE — Therapy (Signed)
[place], [place], [address] Phone: [phone]   Fax:  [phone]  Physical Therapy Treatment/Discharge  Patient Details  Name: Lindsey Sanchez MRN: [id number] Date of Birth: [birth year] Referring Provider (PT): Renette Butters, MD   Encounter Date: 08/08/2020   PT End of Session - 08/08/20 1817     Visit Number 6    Number of Visits 17    Date for PT Re-Evaluation 08/31/20    Authorization Type [employer]    Authorization Time Period 6/22-8/20/22    Authorization - Visit Number 5    Authorization - Number of Visits 16    PT Start Time 9622    PT Stop Time 1834    PT Time Calculation (min) 39 min    Activity Tolerance Patient tolerated treatment well    Behavior During Therapy [facility] for tasks assessed/performed             Past Medical History:  Diagnosis Date   Medical history non-contributory    Pre-diabetes     Past Surgical History:  Procedure Laterality Date   ABDOMINAL HYSTERECTOMY     APPENDECTOMY     TOTAL HIP ARTHROPLASTY Left 06/25/2020   Procedure: TOTAL HIP ARTHROPLASTY ANTERIOR APPROACH;  Surgeon: Renette Butters, MD;  Location: [facility];  Service: Orthopedics;  Laterality: Left;    There were no vitals filed for this visit.   Subjective Assessment - 08/08/20 1757     Subjective Patient reports overall the hip is alright. The hip is sore in the morning. She had f/u with surgeon who is pleased with her progress thus far. She is wanting to discuss being discharged tonight.    How long can you sit comfortably? sitting is good    How long can you stand comfortably? probably an hour    How long can you walk comfortably? I haven't done any long distance walking, but it's been pretty good.    Patient Stated Goals to be able to walk normally    Currently in Pain? No/denies                [facility] PT Assessment - 08/08/20 0001       AROM   Left Hip Extension 0     Left Hip Flexion 110      Strength   Right Hip Flexion 5/5    Right Hip ABduction 4+/5    Left Hip Flexion 5/5    Left Hip ABduction 4/5    Right Knee Flexion 5/5    Right Knee Extension 5/5    Left Knee Flexion 5/5    Left Knee Extension 5/5      Ambulation/Gait   Ambulation/Gait Yes    Gait Comments lateral trunk lean left, limited hip extension left                           [facility] Adult PT Treatment/Exercise - 08/08/20 0001       Self-Care   Other Self-Care Comments  see patient education      Knee/Hip Exercises: Stretches   Hip Flexor Stretch 30 seconds    Hip Flexor Stretch Limitations right      Knee/Hip Exercises: Aerobic   Elliptical 3 minutes level 1    Recumbent Bike 5 minutes level 5      Knee/Hip Exercises: Standing   Hip Abduction 10 reps    Abduction Limitations red band x2  bilateral    Hip Extension 10 reps    Extension Limitations red band x 2 bilateral    Functional Squat 10 reps      Knee/Hip Exercises: Supine   Bridges 10 reps      Knee/Hip Exercises: Sidelying   Clams 2 x 10 blue band      Knee/Hip Exercises: Prone   Straight Leg Raises 5 reps    Straight Leg Raises Limitations bilateral    Other Prone Exercises prone pressup on elbows 2 reps                    PT Education - 08/08/20 1851     Education Details Education on functional progress. D/C education. Updated HEP.    Person(s) Educated Patient    Methods Explanation;Demonstration;Verbal cues;Handout    Comprehension Verbalized understanding;Returned demonstration;Verbal cues required              PT Short Term Goals - 08/08/20 1820       PT SHORT TERM GOAL #1   Title pt to be IND with inital HEP    Time 2    Period Weeks    Status Achieved    Target Date 07/15/20      PT SHORT TERM GOAL #2   Title Patient will demonstrate at least 90 degrees of Lt hip flexion AROM to improve ability to complete sit <> stand transfer.    Baseline 60  degrees    Time 4    Period Weeks    Status Achieved    Target Date 07/29/20      PT SHORT TERM GOAL #3   Title Patient will demonstrate neutral Lt hip extension AROM to improve stride.    Baseline see flowsheet    Time 4    Period Weeks    Status Achieved    Target Date 07/29/20      PT SHORT TERM GOAL #4   Title Patient will complete TUG in less than 20 seconds with LRAD to improve functional mobility and decrease fall risk.    Baseline 8 seconds    Time 4    Period Weeks    Status Achieved    Target Date 07/29/20               PT Long Term Goals - 08/08/20 1820       PT LONG TERM GOAL #1   Title Patient will demonstrate normalized gait with LRAD.    Baseline limited hip extension Lt    Time 8    Period Weeks    Status On-going      PT LONG TERM GOAL #2   Title Patient will demonstrate at least 4+/5 Lt hip strength to improve stability about the chain with prolonged walking/standing activity.    Baseline see flowsheet    Time 8    Period Weeks    Status Partially Met      PT LONG TERM GOAL #3   Title Patient will self report tolerating at least 60 minutes of sitting and standing activity to improve her tolerance to work specific activities.    Baseline see subjective    Time 8    Period Weeks    Status Achieved                   Plan - 08/08/20 1807     Clinical Impression Statement Patient has functionally progressed well since start of care s/p Lt THA. She has  met all short term functional goals and one long term functional goal as this time. She demonstrates normalized hip flexion AROM and neutral hip extension. Her hip/knee strength has gradually improved though weakness remains in hip abductors and extensors. While she does have lingering hip extension ROM limitations and hip weakness, she is requesting D/C at this time and feels that she can continue to progress her strength and ROM indepedently. She is therefore appropriate for discharge at  this time to HEP.    Examination-Activity Limitations --    PT Treatment/Interventions ADLs/Self Care Home Management;Cryotherapy;Moist Heat;Gait training;Therapeutic exercise;Therapeutic activities;Neuromuscular re-education;Manual techniques;Passive range of motion;Patient/family education;Taping;Stair training;Balance training;Dry needling    PT Next Visit Plan --    PT Home Exercise Plan Access Code: [email or web address]    Consulted and Agree with Plan of Care Patient             Patient will benefit from skilled therapeutic intervention in order to improve the following deficits and impairments:  Abnormal gait, Improper body mechanics, Pain, Decreased activity tolerance, Decreased endurance, Decreased range of motion, Difficulty walking, Decreased balance, Decreased strength  Visit Diagnosis: Pain in left hip  Stiffness of left hip, not elsewhere classified  Muscle weakness (generalized)  Difficulty in walking, not elsewhere classified     Problem List Patient Active Problem List   Diagnosis Date Noted   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 Patient goals were partially met. Patient is being discharged due to the patient's request. Gwendolyn Grant, PT, DPT, ATC 08/08/20 6:54 PM  [place], [place], [address] Phone: [phone]   Fax:  [phone]  Name: Lindsey Sanchez MRN: [id number] Date of Birth: 13-May-1970

## 2020-10-01 ENCOUNTER — Other Ambulatory Visit: Payer: Self-pay | Admitting: Internal Medicine

## 2020-10-01 DIAGNOSIS — Z1231 Encounter for screening mammogram for malignant neoplasm of breast: Secondary | ICD-10-CM

## 2021-01-07 ENCOUNTER — Ambulatory Visit
Admission: RE | Admit: 2021-01-07 | Discharge: 2021-01-07 | Disposition: A | Discharge status: Home / Self Care | Payer: Medicaid Other | Source: Ambulatory Visit | Attending: Internal Medicine | Admitting: Internal Medicine

## 2021-01-07 DIAGNOSIS — Z1231 Encounter for screening mammogram for malignant neoplasm of breast: Secondary | ICD-10-CM

## 2021-01-16 ENCOUNTER — Other Ambulatory Visit: Payer: Self-pay | Admitting: Internal Medicine

## 2021-01-16 DIAGNOSIS — R928 Other abnormal and inconclusive findings on diagnostic imaging of breast: Secondary | ICD-10-CM

## 2021-02-12 ENCOUNTER — Other Ambulatory Visit: Payer: Medicaid Other

## 2021-03-03 ENCOUNTER — Other Ambulatory Visit: Payer: Self-pay | Admitting: Internal Medicine

## 2021-03-03 ENCOUNTER — Ambulatory Visit
Admission: RE | Admit: 2021-03-03 | Discharge: 2021-03-03 | Disposition: A | Discharge status: Home / Self Care | Payer: Medicaid Other | Source: Ambulatory Visit | Attending: Internal Medicine | Admitting: Internal Medicine

## 2021-03-03 DIAGNOSIS — R928 Other abnormal and inconclusive findings on diagnostic imaging of breast: Secondary | ICD-10-CM

## 2022-01-02 ENCOUNTER — Encounter: Payer: Self-pay | Admitting: Physician Assistant

## 2022-01-07 ENCOUNTER — Encounter: Payer: Self-pay | Admitting: Physician Assistant

## 2022-01-16 ENCOUNTER — Encounter: Payer: Self-pay | Admitting: Physician Assistant

## 2022-01-16 ENCOUNTER — Other Ambulatory Visit: Payer: Self-pay | Admitting: Physician Assistant

## 2022-01-16 DIAGNOSIS — R102 Pelvic and perineal pain: Secondary | ICD-10-CM

## 2022-01-16 DIAGNOSIS — R1030 Lower abdominal pain, unspecified: Secondary | ICD-10-CM

## 2022-02-04 ENCOUNTER — Ambulatory Visit
Admission: RE | Admit: 2022-02-04 | Discharge: 2022-02-04 | Disposition: A | Discharge status: Home / Self Care | Payer: Medicaid Other | Source: Ambulatory Visit | Attending: Physician Assistant | Admitting: Physician Assistant

## 2022-02-04 DIAGNOSIS — R102 Pelvic and perineal pain: Secondary | ICD-10-CM

## 2022-02-04 DIAGNOSIS — R1030 Lower abdominal pain, unspecified: Secondary | ICD-10-CM

## 2022-02-18 ENCOUNTER — Other Ambulatory Visit: Payer: Self-pay | Admitting: Internal Medicine

## 2022-02-18 DIAGNOSIS — Z1231 Encounter for screening mammogram for malignant neoplasm of breast: Secondary | ICD-10-CM

## 2022-04-15 ENCOUNTER — Other Ambulatory Visit: Payer: Self-pay | Admitting: Physician Assistant

## 2022-04-15 DIAGNOSIS — N63 Unspecified lump in unspecified breast: Secondary | ICD-10-CM

## 2022-04-23 LAB — HEMOGLOBIN A1C: Hemoglobin A1C: 6.2

## 2022-04-23 LAB — AMB RESULTS CONSOLE CBG: Glucose: 105

## 2022-04-23 NOTE — Progress Notes (Signed)
Patient provided Forensic psychologist.

## 2022-04-29 ENCOUNTER — Encounter: Payer: Self-pay | Admitting: *Deleted

## 2022-04-29 ENCOUNTER — Ambulatory Visit
Admission: RE | Admit: 2022-04-29 | Discharge: 2022-04-29 | Disposition: A | Discharge status: Home / Self Care | Payer: Medicaid Other | Source: Ambulatory Visit | Attending: Physician Assistant | Admitting: Physician Assistant

## 2022-04-29 DIAGNOSIS — N63 Unspecified lump in unspecified breast: Secondary | ICD-10-CM

## 2022-04-29 NOTE — Progress Notes (Signed)
Pt seen at 04/23/22 screening event where screening b/p was 133/93 and POCT blood sugar was 105 and A1C was 6.2. Chart review indicates pt's last [facility]-accessible A1C was also 6.2 over a year ago. Pt's b/p on 04/29/22 at a consult OV was 142/93 and those similar v/s and office notes were routed to pt's PCP, Dr. Jackie Plum, whose EMR is not visible in [facility] Pt also received her own screening results at the time of the event to share with her PCP. Pt did identify a food insecurity at the event and was given a list of local available food pantries. Chart revieiw does indicate that pt also has ongoing OB/GYN and GI follow up. No additional health equity team support indicated at this time.

## 2022-05-15 ENCOUNTER — Other Ambulatory Visit (HOSPITAL_COMMUNITY): Payer: Self-pay | Admitting: Orthopedic Surgery

## 2022-05-15 DIAGNOSIS — T84031A Mechanical loosening of internal left hip prosthetic joint, initial encounter: Secondary | ICD-10-CM

## 2022-05-21 ENCOUNTER — Encounter (HOSPITAL_COMMUNITY): Payer: Medicaid Other

## 2022-06-04 IMAGING — DX DG PORTABLE PELVIS
1 series · 1 of 1 positions shown · non-contrast
Comparison: Intraoperative imaging earlier today.

CLINICAL DATA: Status post left hip replacement today.

EXAM:
PORTABLE PELVIS 1-2 VIEWS

[pelvis ap]
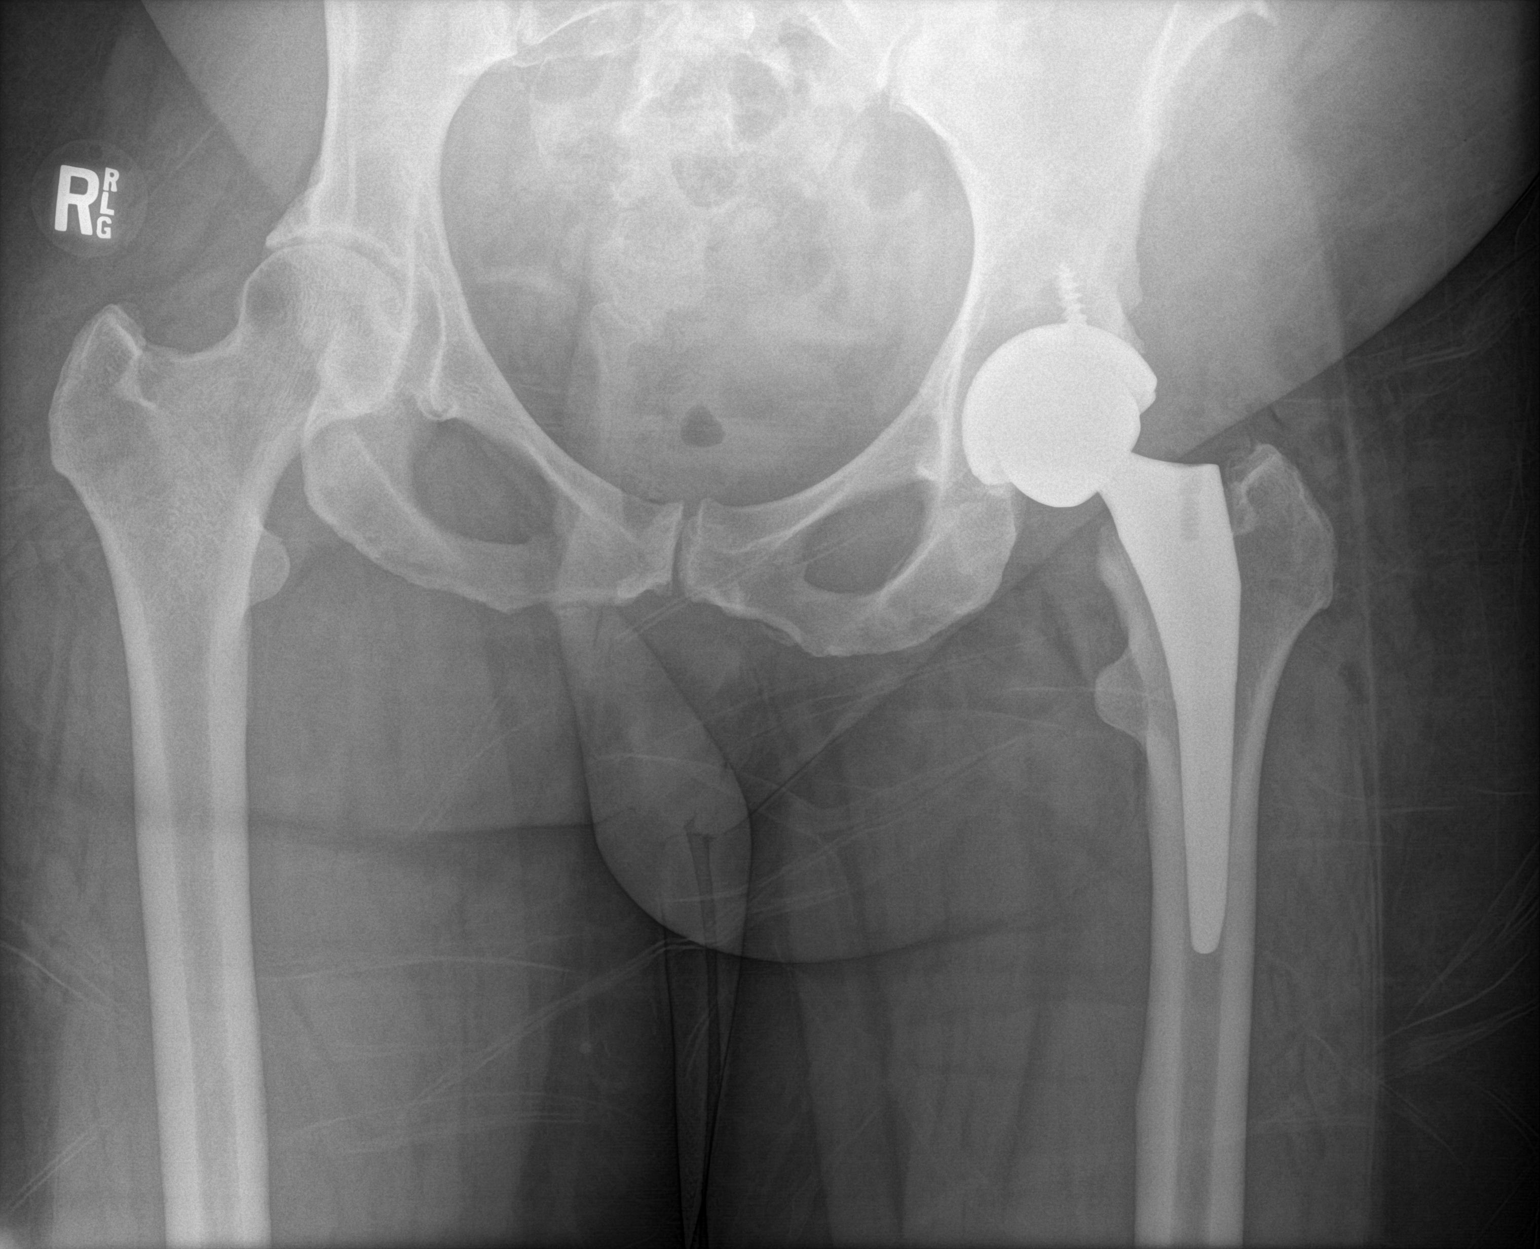

[1 of 1 positions shown; findings below may reference images not displayed]

FINDINGS: Left total hip arthroplasty is in place. No hardware complication or
acute bony abnormality is seen. There is some gas in the soft
tissues from surgery.
IMPRESSION: Status post left hip replacement.  No acute abnormality.

## 2022-06-04 IMAGING — RF DG HIP (WITH PELVIS) OPERATIVE*L*
1 series · 2 of 2 positions shown · non-contrast
Comparison: left hip MRI 04/08/2020

FLUOROSCOPY TIME:  0 minutes 11 seconds

CLINICAL DATA: 50-year-old female status post left anterior hip
replacement.

EXAM:
OPERATIVE LEFT HIP (WITH PELVIS IF PERFORMED) 2 VIEWS
TECHNIQUE: Fluoroscopic spot image(s) were submitted for interpretation
post-operatively.

[Series 1: unknown protocol · 0.20mm/px · 2 of 2 slices shown]
[im 1/2]
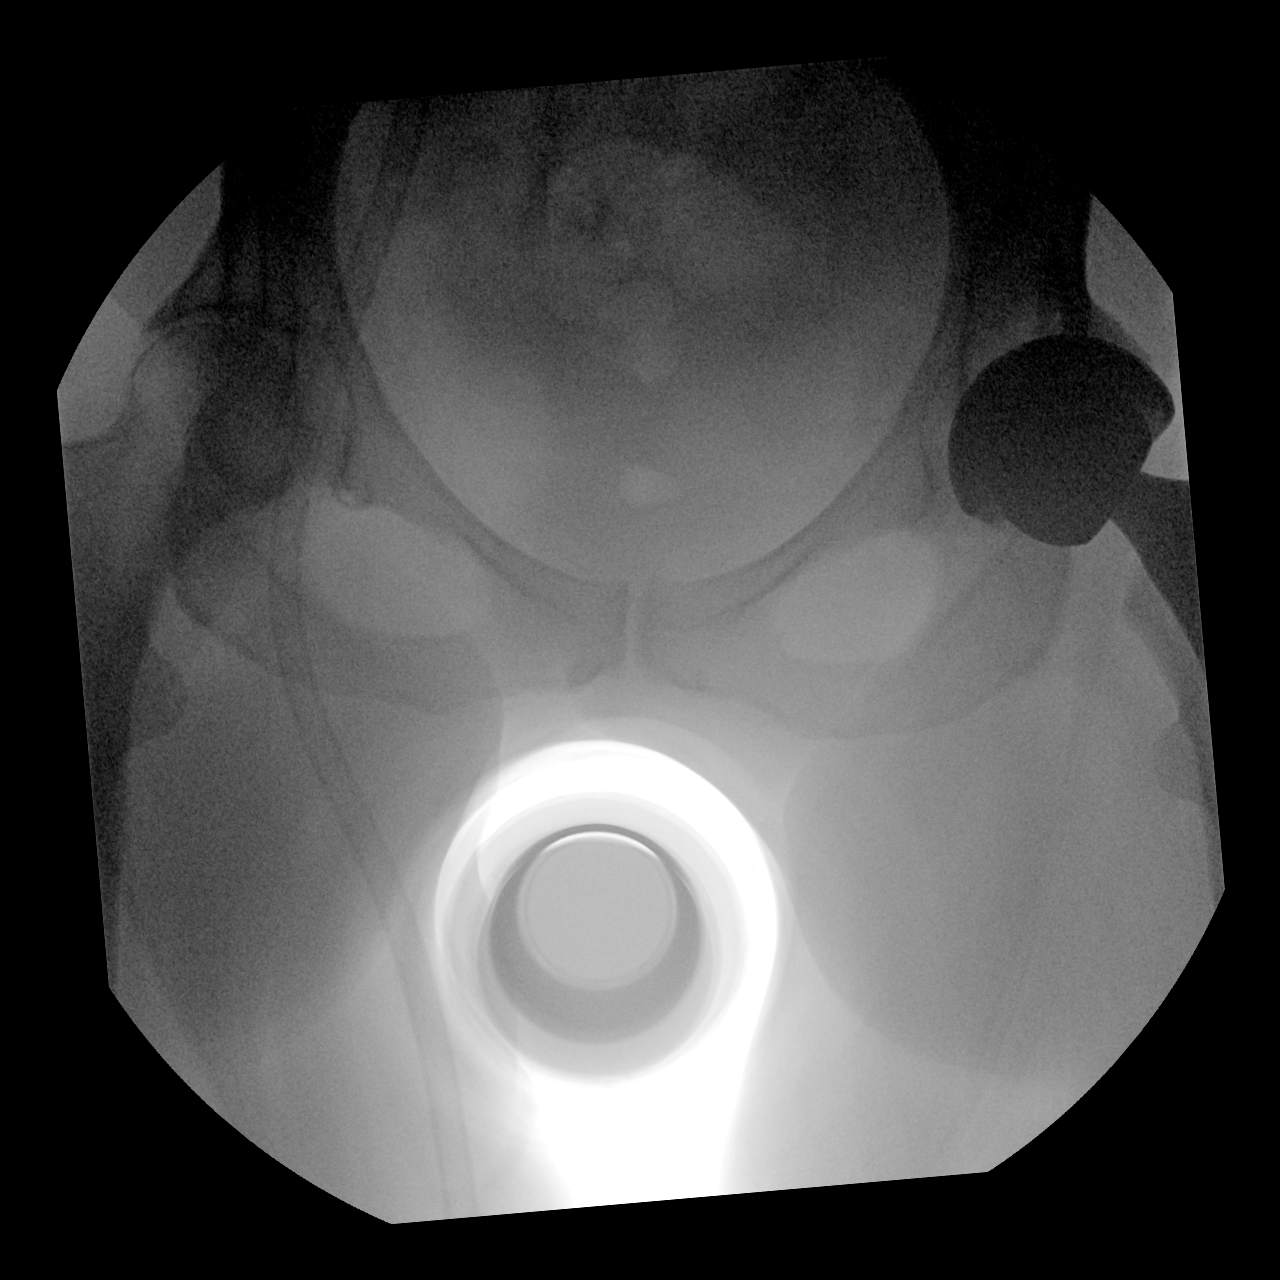
[im 2/2]
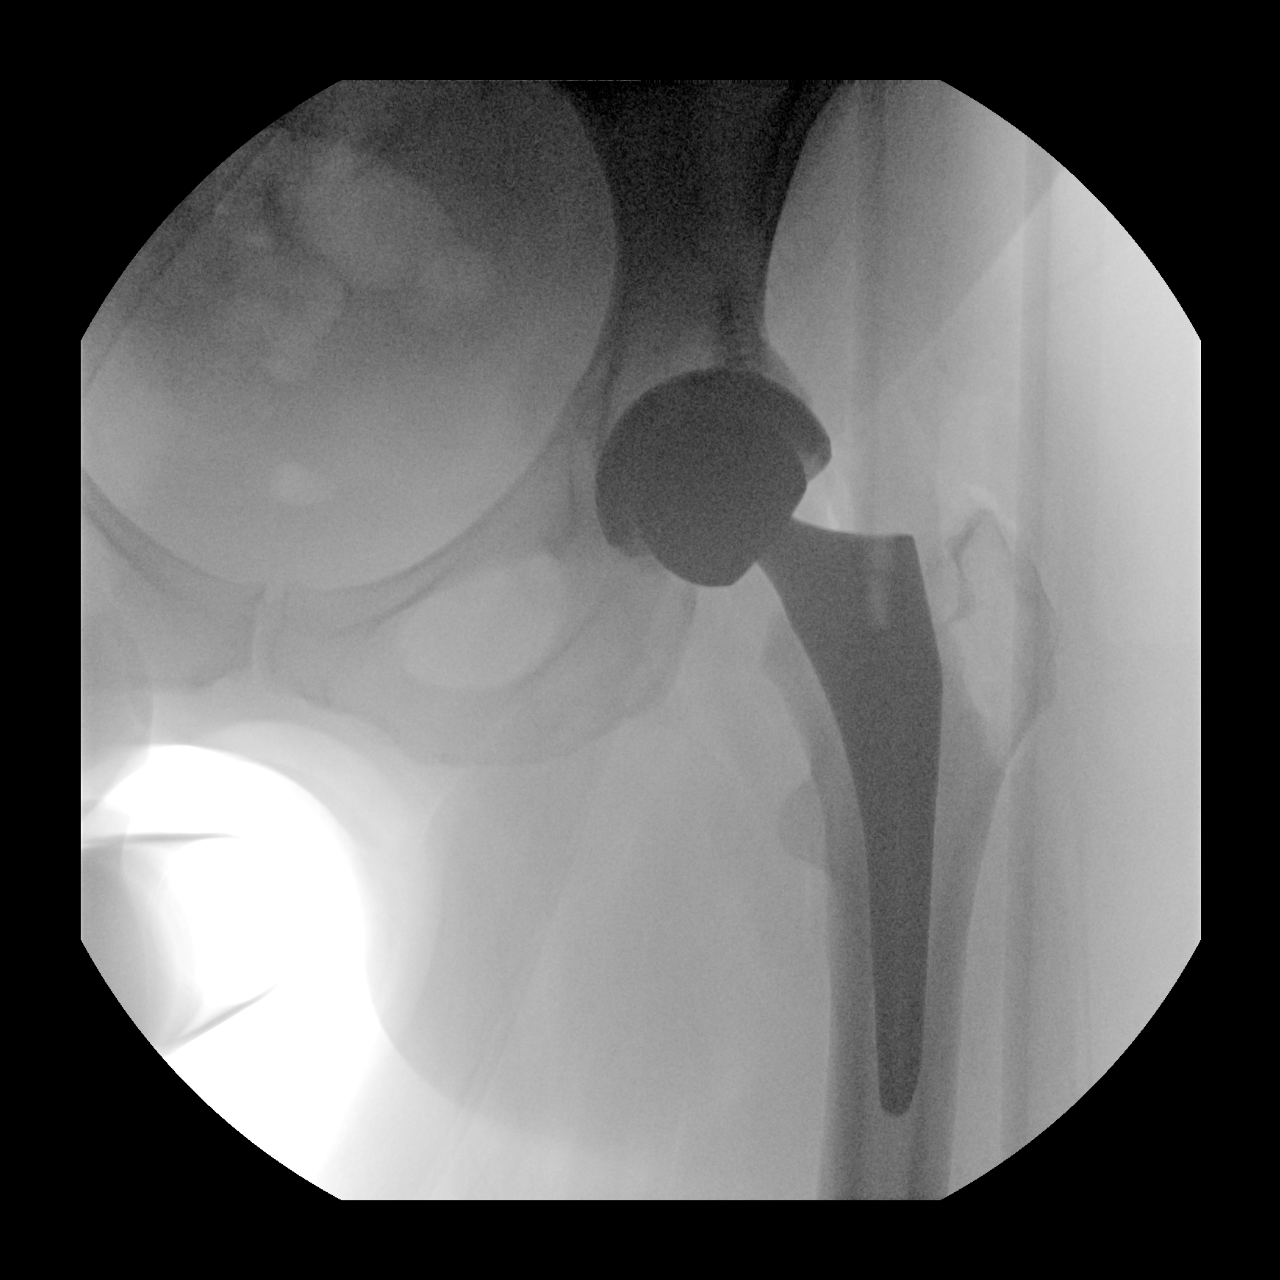

[2 of 2 positions shown; findings below may reference images not displayed]

FINDINGS: Two intraoperative fluoroscopic AP spot views of the lower pelvis
and left hip demonstrate left total hip arthroplasty hardware with
normal AP alignment. No unexpected osseous changes.
IMPRESSION: Intraoperative images of left total hip arthroplasty with no adverse
features.

## 2022-06-30 ENCOUNTER — Encounter (HOSPITAL_COMMUNITY): Payer: Medicaid Other

## 2022-07-31 ENCOUNTER — Encounter (HOSPITAL_COMMUNITY): Payer: Self-pay

## 2022-07-31 ENCOUNTER — Other Ambulatory Visit (HOSPITAL_COMMUNITY): Payer: Medicaid Other

## 2022-07-31 ENCOUNTER — Encounter (HOSPITAL_COMMUNITY): Payer: Medicaid Other

## 2022-11-17 ENCOUNTER — Inpatient Hospital Stay (HOSPITAL_COMMUNITY)
Admission: EM | Admit: 2022-11-17 | Discharge: 2022-11-21 | DRG: 419 | Disposition: A | Discharge status: Home / Self Care | Payer: Medicaid Other | Attending: Internal Medicine | Admitting: Internal Medicine

## 2022-11-17 ENCOUNTER — Encounter (HOSPITAL_COMMUNITY): Payer: Self-pay

## 2022-11-17 ENCOUNTER — Other Ambulatory Visit: Payer: Self-pay

## 2022-11-17 ENCOUNTER — Emergency Department (HOSPITAL_COMMUNITY): Payer: Medicaid Other

## 2022-11-17 DIAGNOSIS — Z6831 Body mass index (BMI) 31.0-31.9, adult: Secondary | ICD-10-CM

## 2022-11-17 DIAGNOSIS — K8064 Calculus of gallbladder and bile duct with chronic cholecystitis without obstruction: Principal | ICD-10-CM | POA: Diagnosis present

## 2022-11-17 DIAGNOSIS — Z90710 Acquired absence of both cervix and uterus: Secondary | ICD-10-CM

## 2022-11-17 DIAGNOSIS — K8065 Calculus of gallbladder and bile duct with chronic cholecystitis with obstruction: Principal | ICD-10-CM | POA: Diagnosis present

## 2022-11-17 DIAGNOSIS — R748 Abnormal levels of other serum enzymes: Secondary | ICD-10-CM | POA: Diagnosis present

## 2022-11-17 DIAGNOSIS — Z791 Long term (current) use of non-steroidal anti-inflammatories (NSAID): Secondary | ICD-10-CM

## 2022-11-17 DIAGNOSIS — I10 Essential (primary) hypertension: Secondary | ICD-10-CM | POA: Diagnosis present

## 2022-11-17 DIAGNOSIS — K8050 Calculus of bile duct without cholangitis or cholecystitis without obstruction: Principal | ICD-10-CM | POA: Diagnosis present

## 2022-11-17 DIAGNOSIS — Z833 Family history of diabetes mellitus: Secondary | ICD-10-CM

## 2022-11-17 DIAGNOSIS — Z96642 Presence of left artificial hip joint: Secondary | ICD-10-CM | POA: Diagnosis present

## 2022-11-17 DIAGNOSIS — R7989 Other specified abnormal findings of blood chemistry: Secondary | ICD-10-CM | POA: Diagnosis present

## 2022-11-17 DIAGNOSIS — R7303 Prediabetes: Secondary | ICD-10-CM | POA: Diagnosis present

## 2022-11-17 DIAGNOSIS — E669 Obesity, unspecified: Secondary | ICD-10-CM | POA: Diagnosis present

## 2022-11-17 DIAGNOSIS — R7401 Elevation of levels of liver transaminase levels: Secondary | ICD-10-CM | POA: Diagnosis present

## 2022-11-17 DIAGNOSIS — Z79899 Other long term (current) drug therapy: Secondary | ICD-10-CM

## 2022-11-17 LAB — CBC
HCT: 40.9 % (ref 36.0–46.0)
Hemoglobin: 13.9 g/dL (ref 12.0–15.0)
MCH: 32.4 pg (ref 26.0–34.0)
MCHC: 34 g/dL (ref 30.0–36.0)
MCV: 95.3 fL (ref 80.0–100.0)
Platelets: 302 10*3/uL (ref 150–400)
RBC: 4.29 MIL/uL (ref 3.87–5.11)
RDW: 13.1 % (ref 11.5–15.5)
WBC: 7.6 10*3/uL (ref 4.0–10.5)
nRBC: 0 % (ref 0.0–0.2)

## 2022-11-17 NOTE — ED Triage Notes (Signed)
Pt reports with chest tightness that goes into her left arm along with nausea that started about 2 pm today.

## 2022-11-18 ENCOUNTER — Encounter (HOSPITAL_COMMUNITY): Payer: Self-pay

## 2022-11-18 ENCOUNTER — Emergency Department (HOSPITAL_COMMUNITY): Payer: Medicaid Other

## 2022-11-18 DIAGNOSIS — I10 Essential (primary) hypertension: Secondary | ICD-10-CM | POA: Diagnosis present

## 2022-11-18 DIAGNOSIS — R748 Abnormal levels of other serum enzymes: Secondary | ICD-10-CM | POA: Diagnosis present

## 2022-11-18 DIAGNOSIS — Z6831 Body mass index (BMI) 31.0-31.9, adult: Secondary | ICD-10-CM | POA: Diagnosis not present

## 2022-11-18 DIAGNOSIS — K8064 Calculus of gallbladder and bile duct with chronic cholecystitis without obstruction: Secondary | ICD-10-CM | POA: Diagnosis present

## 2022-11-18 DIAGNOSIS — R7401 Elevation of levels of liver transaminase levels: Secondary | ICD-10-CM | POA: Diagnosis present

## 2022-11-18 DIAGNOSIS — K8050 Calculus of bile duct without cholangitis or cholecystitis without obstruction: Secondary | ICD-10-CM | POA: Diagnosis not present

## 2022-11-18 DIAGNOSIS — R7303 Prediabetes: Secondary | ICD-10-CM | POA: Diagnosis present

## 2022-11-18 DIAGNOSIS — K8065 Calculus of gallbladder and bile duct with chronic cholecystitis with obstruction: Secondary | ICD-10-CM | POA: Diagnosis present

## 2022-11-18 DIAGNOSIS — Z90710 Acquired absence of both cervix and uterus: Secondary | ICD-10-CM | POA: Diagnosis not present

## 2022-11-18 DIAGNOSIS — Z96642 Presence of left artificial hip joint: Secondary | ICD-10-CM | POA: Diagnosis present

## 2022-11-18 DIAGNOSIS — Z79899 Other long term (current) drug therapy: Secondary | ICD-10-CM | POA: Diagnosis not present

## 2022-11-18 DIAGNOSIS — R7989 Other specified abnormal findings of blood chemistry: Secondary | ICD-10-CM | POA: Diagnosis present

## 2022-11-18 DIAGNOSIS — E669 Obesity, unspecified: Secondary | ICD-10-CM | POA: Diagnosis present

## 2022-11-18 DIAGNOSIS — Z833 Family history of diabetes mellitus: Secondary | ICD-10-CM | POA: Diagnosis not present

## 2022-11-18 DIAGNOSIS — Z791 Long term (current) use of non-steroidal anti-inflammatories (NSAID): Secondary | ICD-10-CM | POA: Diagnosis not present

## 2022-11-18 DIAGNOSIS — K8070 Calculus of gallbladder and bile duct without cholecystitis without obstruction: Secondary | ICD-10-CM | POA: Diagnosis not present

## 2022-11-18 LAB — HEPATIC FUNCTION PANEL
ALT: 259 U/L — ABNORMAL HIGH (ref 0–44)
AST: 457 U/L — ABNORMAL HIGH (ref 15–41)
Albumin: 3.9 g/dL (ref 3.5–5.0)
Alkaline Phosphatase: 98 U/L (ref 38–126)
Bilirubin, Direct: 0.5 mg/dL — ABNORMAL HIGH (ref 0.0–0.2)
Indirect Bilirubin: 0.6 mg/dL (ref 0.3–0.9)
Total Bilirubin: 1.1 mg/dL (ref <1.2)
Total Protein: 7.2 g/dL (ref 6.5–8.1)

## 2022-11-18 LAB — BASIC METABOLIC PANEL
Anion gap: 8 (ref 5–15)
BUN: 11 mg/dL (ref 6–20)
CO2: 26 mmol/L (ref 22–32)
Calcium: 9.4 mg/dL (ref 8.9–10.3)
Chloride: 100 mmol/L (ref 98–111)
Creatinine, Ser: 0.75 mg/dL (ref 0.44–1.00)
GFR, Estimated: >60 mL/min (ref >60)
Glucose, Bld: 141 mg/dL — ABNORMAL HIGH (ref 70–99)
Potassium: 3.6 mmol/L (ref 3.5–5.1)
Sodium: 134 mmol/L — ABNORMAL LOW (ref 135–145)

## 2022-11-18 LAB — TROPONIN I (HIGH SENSITIVITY)
Troponin I (High Sensitivity): 3 ng/L (ref <18)
Troponin I (High Sensitivity): 3 ng/L (ref <18)

## 2022-11-18 LAB — LIPASE, BLOOD: Lipase: 37 U/L (ref 11–51)

## 2022-11-18 MED ORDER — KETOROLAC TROMETHAMINE 15 MG/ML IJ SOLN
15.0000 mg | Freq: Once | INTRAMUSCULAR | Status: AC
  Administered 2022-11-18: 15 mg via INTRAVENOUS
  Filled 2022-11-18: qty 1

## 2022-11-18 MED ORDER — MORPHINE SULFATE (PF) 4 MG/ML IV SOLN
4.0000 mg | Freq: Once | INTRAVENOUS | Status: AC
  Administered 2022-11-18: 4 mg via INTRAVENOUS
  Filled 2022-11-18: qty 1

## 2022-11-18 MED ORDER — HYDROMORPHONE HCL 1 MG/ML IJ SOLN: 0.5000 mg | INTRAMUSCULAR | Status: DC | PRN

## 2022-11-18 MED ORDER — PROCHLORPERAZINE EDISYLATE 10 MG/2ML IJ SOLN
5.0000 mg | Freq: Four times a day (QID) | INTRAMUSCULAR | Status: DC | PRN
  Administered 2022-11-18: 5 mg via INTRAVENOUS
  Filled 2022-11-18: qty 2

## 2022-11-18 MED ORDER — ALBUTEROL SULFATE (2.5 MG/3ML) 0.083% IN NEBU: 2.5000 mg | INHALATION_SOLUTION | RESPIRATORY_TRACT | Status: DC | PRN

## 2022-11-18 MED ORDER — MELOXICAM 15 MG PO TABS: 15.0000 mg | ORAL_TABLET | Freq: Every day | ORAL | Status: DC | PRN

## 2022-11-18 MED ORDER — ONDANSETRON HCL 4 MG/2ML IJ SOLN
4.0000 mg | Freq: Once | INTRAMUSCULAR | Status: AC
  Administered 2022-11-18: 4 mg via INTRAVENOUS
  Filled 2022-11-18: qty 2

## 2022-11-18 MED ORDER — POLYETHYLENE GLYCOL 3350 17 G PO PACK: 17.0000 g | PACK | Freq: Every day | ORAL | Status: DC | PRN

## 2022-11-18 MED ORDER — OXYCODONE HCL 5 MG PO TABS
5.0000 mg | ORAL_TABLET | Freq: Four times a day (QID) | ORAL | Status: DC | PRN
  Administered 2022-11-18 – 2022-11-20 (×2): 5 mg via ORAL
  Filled 2022-11-18 (×2): qty 1

## 2022-11-18 MED ORDER — SODIUM CHLORIDE 0.9 % IV SOLN: INTRAVENOUS | Status: AC

## 2022-11-18 MED ORDER — ALUM & MAG HYDROXIDE-SIMETH 200-200-20 MG/5ML PO SUSP
30.0000 mL | Freq: Once | ORAL | Status: DC
  Filled 2022-11-18: qty 30

## 2022-11-18 MED ORDER — LIDOCAINE VISCOUS HCL 2 % MT SOLN: 15.0000 mL | Freq: Once | OROMUCOSAL | Status: DC

## 2022-11-18 MED ORDER — ENOXAPARIN SODIUM 40 MG/0.4ML IJ SOSY
40.0000 mg | PREFILLED_SYRINGE | INTRAMUSCULAR | Status: DC
Start: 2022-11-18 — End: 2022-11-20
  Administered 2022-11-18: 40 mg via SUBCUTANEOUS
  Filled 2022-11-18: qty 0.4

## 2022-11-18 MED ORDER — FLUTICASONE PROPIONATE 50 MCG/ACT NA SUSP: 1.0000 | Freq: Every day | NASAL | Status: DC | PRN

## 2022-11-18 MED ORDER — LEVOCETIRIZINE DIHYDROCHLORIDE 5 MG PO TABS: 5.0000 mg | ORAL_TABLET | Freq: Every evening | ORAL | Status: DC

## 2022-11-18 MED ORDER — IOHEXOL 350 MG/ML SOLN
100.0000 mL | Freq: Once | INTRAVENOUS | Status: AC | PRN
  Administered 2022-11-18: 100 mL via INTRAVENOUS

## 2022-11-18 MED ORDER — MONTELUKAST SODIUM 10 MG PO TABS: 10.0000 mg | ORAL_TABLET | Freq: Every day | ORAL | Status: DC

## 2022-11-18 MED ORDER — MELATONIN 5 MG PO TABS: 5.0000 mg | ORAL_TABLET | Freq: Every evening | ORAL | Status: DC | PRN

## 2022-11-18 NOTE — Consult Note (Signed)
Reason for Consult: CBD stone Referring Physician: Hospital team  Neya Creegan is an 52 y.o. female.  HPI: Patient seen and examined and her hospital computer chart reviewed including care everywhere and she has not had abdominal problems since her appendectomy which was done for a appendiceal polyp and she does not believe she ever had an endoscopy before and yesterday had acute onset of pain nausea and vomiting and is feeling better today and is not aware of gallstones running in the family and has no other complaints  Past Medical History:  Diagnosis Date   Medical history non-contributory    Pre-diabetes     Past Surgical History:  Procedure Laterality Date   ABDOMINAL HYSTERECTOMY     APPENDECTOMY     TOTAL HIP ARTHROPLASTY Left 06/25/2020   Procedure: TOTAL HIP ARTHROPLASTY ANTERIOR APPROACH;  Surgeon: Sheral Apley, MD;  Location: [facility];  Service: Orthopedics;  Laterality: Left;    Family History  Problem Relation Age of Onset   Diabetes Mother    Diabetes Father    Breast cancer Neg Hx     Social History:  reports that she has never smoked. She has never used smokeless tobacco. She reports current alcohol use. She reports that she does not use drugs.  Allergies: No Known Allergies  Medications: I have reviewed the patient's current medications.  Results for orders placed or performed during the hospital encounter of 11/17/22 (from the past 48 hour(s))  Basic metabolic panel     Status: Abnormal   Collection Time: 11/17/22 11:17 PM  Result Value Ref Range   Sodium 134 (L) 135 - 145 mmol/L   Potassium 3.6 3.5 - 5.1 mmol/L   Chloride 100 98 - 111 mmol/L   CO2 26 22 - 32 mmol/L   Glucose, Bld 141 (H) 70 - 99 mg/dL    Comment: Glucose reference range applies only to samples taken after fasting for at least 8 hours.   BUN 11 6 - 20 mg/dL   Creatinine, Ser 3.50 0.44 - 1.00 mg/dL   Calcium 9.4 8.9 - 09.3 mg/dL   GFR, Estimated >81 >82 mL/min    Comment:  (NOTE) Calculated using the CKD-EPI Creatinine Equation (2021)    Anion gap 8 5 - 15    Comment: Performed at [facility], [address], [place], [place] [address]  CBC     Status: None   Collection Time: 11/17/22 11:17 PM  Result Value Ref Range   WBC 7.6 4.0 - 10.5 K/uL   RBC 4.29 3.87 - 5.11 MIL/uL   Hemoglobin 13.9 12.0 - 15.0 g/dL   HCT 69.6 78.9 - 38.1 %   MCV 95.3 80.0 - 100.0 fL   MCH 32.4 26.0 - 34.0 pg   MCHC 34.0 30.0 - 36.0 g/dL   RDW 01.7 51.0 - 25.8 %   Platelets 302 150 - 400 K/uL   nRBC 0.0 0.0 - 0.2 %    Comment: Performed at [facility], [address], [place], [place] [address]  Troponin I (High Sensitivity)     Status: None   Collection Time: 11/17/22 11:17 PM  Result Value Ref Range   Troponin I (High Sensitivity) 3 <18 ng/L    Comment: (NOTE) Elevated high sensitivity troponin I (hsTnI) values and significant  changes across serial measurements may suggest ACS but many other  chronic and acute conditions are known to elevate hsTnI results.  Refer to the "Links" section for chest pain algorithms  and additional  guidance. Performed at [facility] [place], [address], [place], [place] [address]   Troponin I (High Sensitivity)     Status: None   Collection Time: 11/18/22  2:48 AM  Result Value Ref Range   Troponin I (High Sensitivity) 3 <18 ng/L    Comment: (NOTE) Elevated high sensitivity troponin I (hsTnI) values and significant  changes across serial measurements may suggest ACS but many other  chronic and acute conditions are known to elevate hsTnI results.  Refer to the "Links" section for chest pain algorithms and additional  guidance. Performed at [facility], [address], [place], [place] [address]   Lipase, blood     Status: None   Collection Time: 11/18/22  2:49 AM  Result Value Ref Range   Lipase 37 11 - 51 U/L    Comment: Performed at [facility], [address] [place], [place] [address]  Hepatic function panel     Status: Abnormal   Collection Time: 11/18/22  2:49 AM  Result Value Ref Range   Total Protein 7.2 6.5 - 8.1 g/dL   Albumin 3.9 3.5 - 5.0 g/dL   AST 829 (H) 15 - 41 U/L   ALT 259 (H) 0 - 44 U/L   Alkaline Phosphatase 98 38 - 126 U/L   Total Bilirubin 1.1 <1.2 mg/dL   Bilirubin, Direct 0.5 (H) 0.0 - 0.2 mg/dL   Indirect Bilirubin 0.6 0.3 - 0.9 mg/dL    Comment: Performed at [facility], [address] [place], [place] [address]    CT Angio Chest/Abd/Pel for Dissection W and/or Wo Contrast  Result Date: 11/18/2022 CLINICAL DATA:  Acute aortic syndrome suspected. Chest tightness radiating to left arm with nausea. EXAM: CT ANGIOGRAPHY CHEST, ABDOMEN AND PELVIS TECHNIQUE: Non-contrast CT of the chest was initially obtained. Multidetector CT imaging through the chest, abdomen and pelvis was performed using the standard protocol during bolus administration of intravenous contrast. Multiplanar reconstructed images and MIPs were obtained and reviewed to evaluate the vascular anatomy. RADIATION DOSE REDUCTION: This exam was performed according to the departmental dose-optimization program which includes automated exposure control, adjustment of the mA and/or kV according to patient size and/or use of iterative reconstruction technique. CONTRAST:  OMNIPAQUE IOHEXOL 350 MG/ML SOLN COMPARISON:  None Available. FINDINGS: CTA CHEST FINDINGS Cardiovascular: The heart is normal in size and there is no pericardial effusion. A few scattered coronary artery calcifications are noted. The aorta and pulmonary trunk are normal in caliber. No dissection is seen. Mediastinum/Nodes: No enlarged mediastinal, hilar, or axillary lymph nodes. Thyroid gland, trachea, and esophagus demonstrate no significant findings. Lungs/Pleura: Mild atelectasis or scarring is present bilaterally. No effusion or pneumothorax.  Musculoskeletal: Degenerative changes are present in the thoracic spine. No acute osseous abnormality is seen. Review of the MIP images confirms the above findings. CTA ABDOMEN AND PELVIS FINDINGS VASCULAR Aorta: Normal caliber aorta without aneurysm, dissection, vasculitis or significant stenosis. Celiac: Patent without evidence of aneurysm, dissection, vasculitis or significant stenosis. SMA: Patent without evidence of aneurysm, dissection, vasculitis or significant stenosis. Renals: Both renal arteries are patent without evidence of aneurysm, dissection, vasculitis, fibromuscular dysplasia or significant stenosis. IMA: Patent. Inflow: Patent without evidence of aneurysm, dissection, vasculitis or significant stenosis. Veins: No obvious venous abnormality within the limitations of this arterial phase study. Review of the MIP images confirms the above findings. NON-VASCULAR Hepatobiliary: A 1.8 cm hypodensity is noted in the right lobe of the liver,  possible cyst or hemangioma. Stones are present within the gallbladder. The common bile duct is distended measuring 1.1 cm. There is a 8 mm stone in the distal common bile duct at the ampulla. Pancreas: Unremarkable. No pancreatic ductal dilatation or surrounding inflammatory changes. Spleen: Normal in size without focal abnormality. Adrenals/Urinary Tract: The adrenal glands are within normal limits. The kidneys enhance symmetrically. No renal calculus or hydronephrosis bilaterally. The bladder is unremarkable. Stomach/Bowel: Stomach is within normal limits. Appendix is surgically absent. The cecum and ileocecal junction are noted in the mid left abdomen. No evidence of bowel wall thickening, distention, or inflammatory changes. No free air or pneumatosis is seen. Lymphatic: No abdominal or pelvic lymphadenopathy. Reproductive: The uterus is not seen.  No adnexal masses. Other: No abdominopelvic ascites. A fat containing umbilical hernia is noted. Musculoskeletal:  Total hip arthroplasty changes are present on the left. Sclerosis is present at the sacroiliac joints bilaterally, compatible with sacroiliitis. Degenerative changes are noted in the lumbar spine. No acute osseous abnormality. Review of the MIP images confirms the above findings. IMPRESSION: 1. No evidence of aortic aneurysm or dissection. 2. Distended common bile duct with a 8 mm stone in the distal common bile duct at the ampulla. 3. Cholelithiasis. 4. Coronary artery calcifications. Electronically Signed   By: Thornell Sartorius M.D.   On: 11/18/2022 03:46   DG Chest 2 View  Result Date: 11/18/2022 CLINICAL DATA:  Chest tightness extending to left arm with nausea. EXAM: CHEST - 2 VIEW COMPARISON:  None Available. FINDINGS: The heart size and mediastinal contours are within normal limits. No consolidation, effusion, or pneumothorax. There is dextroscoliosis of the thoracic spine. No acute osseous abnormality is seen. IMPRESSION: No active cardiopulmonary disease. Electronically Signed   By: Thornell Sartorius M.D.   On: 11/18/2022 00:16    ROS negative except above Blood pressure (!) 135/91, pulse 72, temperature 97.7 F (36.5 C), temperature source Oral, resp. rate 14, height 6' (1.829 m), weight 105.7 kg, last menstrual period 04/14/2017, SpO2 100%. Physical Exam Vital signs stable afebrile no acute distress exam pertinent for maybe a little tender in the left lower quadrant but mild and soft CT reviewed positive CBD stones and gallstones increased transaminases CBC lipase okay Assessment/Plan: Gallstones and CBD stones Plan: The risk benefits methods and success rate of ERCP and stone extraction was discussed with the patient and unfortunately there was no time on the endoscopy schedule today so I have scheduled her for tomorrow and she will need a surgical consult for her gallstones and we discussed how that should come out as well to prevent future attacks and will allow clear liquids today and proceed  tomorrow with ERCP  [facility] E 11/18/2022, 11:42 AM

## 2022-11-18 NOTE — H&P (Signed)
History and Physical  Lindsey Sanchez VHQ:[id number] DOB: 01-Aug-1970 DOA: 11/17/2022  PCP: Jackie Plum, MD   Chief Complaint: Abdominal pain, nausea  HPI: Lindsey Sanchez is a 52 y.o. female with medical history significant for hip arthroplasty, appendectomy and hysterectomy being admitted to the hospital with choledocholithiasis.  She presented to the hospital with complaints of chest tightness, upper abdominal pain, and nausea that started about 2 PM yesterday.  She had no vomiting, no fevers.  Currently she feels only a small amount of pain, and no nausea.  Denies any chest pain, shortness of breath.  Workup in the ER revealed elevated LFTs, and choledocholithiasis as detailed below.  GI was consulted overnight, and they recommend medical admission.  Review of Systems: Please see HPI for pertinent positives and negatives. A complete 10 system review of systems are otherwise negative.  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 She has never used smokeless tobacco. She reports current alcohol use. She reports that she does not use drugs.   No Known Allergies  Family History  Problem Relation Age of Onset   Diabetes Mother    Diabetes Father    Breast cancer Neg Hx      Prior to Admission medications   Medication Sig Start Date End Date Taking? Authorizing Provider  fluticasone (FLONASE) 50 MCG/ACT nasal spray Place 1 spray into both nostrils daily as needed for allergies or rhinitis.   Yes [provider]  levocetirizine (XYZAL) 5 MG tablet Take 5 mg by mouth every evening.   Yes  [provider]  meloxicam (MOBIC) 15 MG tablet Take 1 tablet (15 mg total) by mouth daily as needed for pain (and inflammation). 06/27/20  Yes Gawne, Meghan M, PA-C  montelukast (SINGULAIR) 10 MG tablet Take 10 mg by mouth at bedtime.   Yes [provider]  Multiple Vitamin (MULTIVITAMIN WITH MINERALS) TABS tablet Take 1 tablet by mouth daily.   Yes [provider]  omeprazole (PRILOSEC OTC) 20 MG tablet Take 1 tablet (20 mg total) by mouth daily. For gastric protection 06/27/20 11/18/22 Yes Gawne, Meghan M, PA-C  oxyCODONE (ROXICODONE) 5 MG immediate release tablet Take 1 tablet (5 mg total) by mouth every 6 (six) hours as needed for severe pain. Do not take more than 6 tablets in a 24 hour period. 06/27/20  Yes Gawne, Meghan M, PA-C  acetaminophen (TYLENOL) 500 MG tablet Take 2 tablets (1,000 mg total) by mouth every 6 (six) hours as needed for mild pain or moderate pain. Patient not taking: Reported on 11/18/2022 06/27/20   Jenne Pane, PA-C  aspirin 81 MG chewable tablet Chew 1 tablet (81 mg total) by mouth 2 (two) times daily. To prevent blood clots after surgery Patient not taking: Reported on 11/18/2022 06/27/20   Jenne Pane, PA-C  magnesium oxide (MAG-OX) 400 MG tablet Take 400 mg by mouth 2 (two) times a week. Patient not taking: Reported on 11/18/2022    [provider]  methocarbamol (ROBAXIN) 500 MG tablet Take 1 tablet (500 mg total) by mouth every 8 (eight) hours as  needed for muscle spasms. Patient not taking: Reported on 11/18/2022 06/27/20   Jenne Pane, PA-C  ondansetron (ZOFRAN) 4 MG tablet Take 1 tablet (4 mg total) by mouth daily as needed for nausea or vomiting. Patient not taking: Reported on 11/18/2022 06/27/20   Jenne Pane, PA-C    Physical Exam: BP (!) 146/84   Pulse 78   Temp 97.8 F (36.6 C) (Oral)   Resp 16   Ht 6' (1.829 m)   Wt 105.7 kg   LMP 04/14/2017 (Approximate)   SpO2 99%   BMI 31.60 kg/m   General:  Alert,  oriented, calm, in no acute distress  Eyes: EOMI, clear conjuctivae, white sclerea Neck: supple, no masses, trachea mildline  Cardiovascular: RRR, no murmurs or rubs, no peripheral edema  Respiratory: clear to auscultation bilaterally, no wheezes, no crackles  Abdomen: soft, tender diffusely to deep palpation, slightly distended, normal bowel tones heard  Skin: dry, no rashes  Musculoskeletal: no joint effusions, normal range of motion  Psychiatric: appropriate affect, normal speech  Neurologic: extraocular muscles intact, clear speech, moving all extremities with intact sensorium         Labs on Admission:  Basic Metabolic Panel: Recent Labs  Lab 11/17/22 2317  NA 134*  K 3.6  CL 100  CO2 26  GLUCOSE 141*  BUN 11  CREATININE 0.75  CALCIUM 9.4   Liver Function Tests: Recent Labs  Lab 11/18/22 0249  AST 457*  ALT 259*  ALKPHOS 98  BILITOT 1.1  PROT 7.2  ALBUMIN 3.9   Recent Labs  Lab 11/18/22 0249  LIPASE 37   No results for input(s): "AMMONIA" in the last 168 hours. CBC: Recent Labs  Lab 11/17/22 2317  WBC 7.6  HGB 13.9  HCT 40.9  MCV 95.3  PLT 302   Cardiac Enzymes: No results for input(s): "CKTOTAL", "CKMB", "CKMBINDEX", "TROPONINI" in the last 168 hours.  BNP (last 3 results) No results for input(s): "BNP" in the last 8760 hours.  ProBNP (last 3 results) No results for input(s): "PROBNP" in the last 8760 hours.  CBG: No results for input(s): "GLUCAP" in the last 168 hours.  Radiological Exams on Admission: CT Angio Chest/Abd/Pel for Dissection W and/or Wo Contrast  Result Date: 11/18/2022 CLINICAL DATA:  Acute aortic syndrome suspected. Chest tightness radiating to left arm with nausea. EXAM: CT ANGIOGRAPHY CHEST, ABDOMEN AND PELVIS TECHNIQUE: Non-contrast CT of the chest was initially obtained. Multidetector CT imaging through the chest, abdomen and pelvis was performed using the standard protocol during bolus administration of intravenous  contrast. Multiplanar reconstructed images and MIPs were obtained and reviewed to evaluate the vascular anatomy. RADIATION DOSE REDUCTION: This exam was performed according to the departmental dose-optimization program which includes automated exposure control, adjustment of the mA and/or kV according to patient size and/or use of iterative reconstruction technique. CONTRAST:  OMNIPAQUE IOHEXOL 350 MG/ML SOLN COMPARISON:  None Available. FINDINGS: CTA CHEST FINDINGS Cardiovascular: The heart is normal in size and there is no pericardial effusion. A few scattered coronary artery calcifications are noted. The aorta and pulmonary trunk are normal in caliber. No dissection is seen. Mediastinum/Nodes: No enlarged mediastinal, hilar, or axillary lymph nodes. Thyroid gland, trachea, and esophagus demonstrate no significant findings. Lungs/Pleura: Mild atelectasis or scarring is present bilaterally. No effusion or pneumothorax. Musculoskeletal: Degenerative changes are present in the thoracic spine. No acute osseous abnormality is seen. Review of the MIP images confirms the above findings. CTA ABDOMEN AND PELVIS FINDINGS VASCULAR Aorta:  Normal caliber aorta without aneurysm, dissection, vasculitis or significant stenosis. Celiac: Patent without evidence of aneurysm, dissection, vasculitis or significant stenosis. SMA: Patent without evidence of aneurysm, dissection, vasculitis or significant stenosis. Renals: Both renal arteries are patent without evidence of aneurysm, dissection, vasculitis, fibromuscular dysplasia or significant stenosis. IMA: Patent. Inflow: Patent without evidence of aneurysm, dissection, vasculitis or significant stenosis. Veins: No obvious venous abnormality within the limitations of this arterial phase study. Review of the MIP images confirms the above findings. NON-VASCULAR Hepatobiliary: A 1.8 cm hypodensity is noted in the right lobe of the liver, possible cyst or hemangioma. Stones are  present within the gallbladder. The common bile duct is distended measuring 1.1 cm. There is a 8 mm stone in the distal common bile duct at the ampulla. Pancreas: Unremarkable. No pancreatic ductal dilatation or surrounding inflammatory changes. Spleen: Normal in size without focal abnormality. Adrenals/Urinary Tract: The adrenal glands are within normal limits. The kidneys enhance symmetrically. No renal calculus or hydronephrosis bilaterally. The bladder is unremarkable. Stomach/Bowel: Stomach is within normal limits. Appendix is surgically absent. The cecum and ileocecal junction are noted in the mid left abdomen. No evidence of bowel wall thickening, distention, or inflammatory changes. No free air or pneumatosis is seen. Lymphatic: No abdominal or pelvic lymphadenopathy. Reproductive: The uterus is not seen.  No adnexal masses. Other: No abdominopelvic ascites. A fat containing umbilical hernia is noted. Musculoskeletal: Total hip arthroplasty changes are present on the left. Sclerosis is present at the sacroiliac joints bilaterally, compatible with sacroiliitis. Degenerative changes are noted in the lumbar spine. No acute osseous abnormality. Review of the MIP images confirms the above findings. IMPRESSION: 1. No evidence of aortic aneurysm or dissection. 2. Distended common bile duct with a 8 mm stone in the distal common bile duct at the ampulla. 3. Cholelithiasis. 4. Coronary artery calcifications. Electronically Signed   By: Thornell Sartorius M.D.   On: 11/18/2022 03:46   DG Chest 2 View  Result Date: 11/18/2022 CLINICAL DATA:  Chest tightness extending to left arm with nausea. EXAM: CHEST - 2 VIEW COMPARISON:  None Available. FINDINGS: The heart size and mediastinal contours are within normal limits. No consolidation, effusion, or pneumothorax. There is dextroscoliosis of the thoracic spine. No acute osseous abnormality is seen. IMPRESSION: No active cardiopulmonary disease. Electronically Signed   By:  Thornell Sartorius M.D.   On: 11/18/2022 00:16    Assessment/Plan This is a healthy 52 year old female with a history of obesity being admitted to the hospital with symptomatic choledocholithiasis.  Cholelithiasis-with imaging as above demonstrating distended common bile duct with 8 mm stone in the distal common bile duct.  Patient is stable, with no evidence of cholangitis. -Inpatient admission -Keep n.p.o. -Pain and nausea control as needed -Discussed with [facility] Dr. Ewing Schlein, who will consult today  Abnormal LFTs-due to choledocholithiasis, will trend  Hypertension-presumably due to abdominal discomfort, will monitor -IV hydralazine as needed for uncontrolled blood pressure  Obesity-BMI 31.6, complicating all aspects of care  DVT prophylaxis: Lovenox     Code Status: Full Code  Consults called: Gastroenterology  Admission status: The appropriate patient status for this patient is INPATIENT. Inpatient status is judged to be reasonable and necessary in order to provide the required intensity of service to ensure the patient's safety. The patient's presenting symptoms, physical exam findings, and initial radiographic and laboratory data in the context of their chronic comorbidities is felt to place them at high risk for further clinical deterioration. Furthermore, it is not anticipated  that the patient will be medically stable for discharge from the hospital within 2 midnights of admission.    I certify that at the point of admission it is my clinical judgment that the patient will require inpatient hospital care spanning beyond 2 midnights from the point of admission due to high intensity of service, high risk for further deterioration and high frequency of surveillance required  Time spent: 59 minutes  Almee Pelphrey Sharlette Dense MD [facility] Pager [phone]  If 7PM-7AM, please contact night-coverage [email or web address] Password [facility]  11/18/2022, 8:18 AM

## 2022-11-18 NOTE — ED Notes (Signed)
ED TO INPATIENT HANDOFF REPORT  ED Nurse Name and Phone #: Suann Larry Name/Age/Gender Lindsey Sanchez 52 y.o. female Room/Bed: WA21/WA21  Code Status   Code Status: Prior  Home/SNF/Other Home Patient oriented to: self, place, time, and situation Is this baseline? Yes   Triage Complete: Triage complete  Chief Complaint Choledocholithiasis [K80.50]  Triage Note Pt reports with chest tightness that goes into her left arm along with nausea that started about 2 pm today.    Allergies No Known Allergies  Level of Care/Admitting Diagnosis ED Disposition     ED Disposition  Admit   Condition  --   Comment  Hospital Area: [facility] [[id number]]  Level of Care: Med-Surg [16]  May admit patient to [facility] or [facility] if equivalent level of care is available:: Yes  Covid Evaluation: Asymptomatic - no recent exposure (last 10 days) testing not required  Diagnosis: Choledocholithiasis [161096]  Admitting Physician: Darlin Drop [[id number]]  Attending Physician: Darlin Drop [[id number]]  Certification:: I certify this patient will need inpatient services for at least 2 midnights  Expected Medical Readiness: 11/20/2022          B Medical/Surgery History Past Medical History:  Diagnosis Date   Medical history non-contributory    Pre-diabetes    Past Surgical History:  Procedure Laterality Date   ABDOMINAL HYSTERECTOMY     APPENDECTOMY     TOTAL HIP ARTHROPLASTY Left 06/25/2020   Procedure: TOTAL HIP ARTHROPLASTY ANTERIOR APPROACH;  Surgeon: Sheral Apley, MD;  Location: [facility];  Service: Orthopedics;  Laterality: Left;     A IV Location/Drains/Wounds Patient Lines/Drains/Airways Status     Active Line/Drains/Airways     Name Placement date Placement time Site Days   Peripheral IV 11/18/22 20 G 1" Left Antecubital 11/18/22  0235  Antecubital  less than 1            Intake/Output Last 24 hours No intake or output data in the 24  hours ending 11/18/22 0735  Labs/Imaging Results for orders placed or performed during the hospital encounter of 11/17/22 (from the past 48 hour(s))  Basic metabolic panel     Status: Abnormal   Collection Time: 11/17/22 11:17 PM  Result Value Ref Range   Sodium 134 (L) 135 - 145 mmol/L   Potassium 3.6 3.5 - 5.1 mmol/L   Chloride 100 98 - 111 mmol/L   CO2 26 22 - 32 mmol/L   Glucose, Bld 141 (H) 70 - 99 mg/dL    Comment: Glucose reference range applies only to samples taken after fasting for at least 8 hours.   BUN 11 6 - 20 mg/dL   Creatinine, Ser 2.95 0.44 - 1.00 mg/dL   Calcium 9.4 8.9 - 62.1 mg/dL   GFR, Estimated >30 >86 mL/min    Comment: (NOTE) Calculated using the CKD-EPI Creatinine Equation (2021)    Anion gap 8 5 - 15    Comment: Performed at [facility], [address] [place], [place] [address]  CBC     Status: None   Collection Time: 11/17/22 11:17 PM  Result Value Ref Range   WBC 7.6 4.0 - 10.5 K/uL   RBC 4.29 3.87 - 5.11 MIL/uL   Hemoglobin 13.9 12.0 - 15.0 g/dL   HCT 96.2 95.2 - 84.1 %   MCV 95.3 80.0 - 100.0 fL   MCH 32.4 26.0 - 34.0 pg   MCHC 34.0 30.0 - 36.0 g/dL   RDW 13.1  11.5 - 15.5 %   Platelets 302 150 - 400 K/uL   nRBC 0.0 0.0 - 0.2 %    Comment: Performed at [facility], [address], [place], [place] [address]  Troponin I (High Sensitivity)     Status: None   Collection Time: 11/17/22 11:17 PM  Result Value Ref Range   Troponin I (High Sensitivity) 3 <18 ng/L    Comment: (NOTE) Elevated high sensitivity troponin I (hsTnI) values and significant  changes across serial measurements may suggest ACS but many other  chronic and acute conditions are known to elevate hsTnI results.  Refer to the "Links" section for chest pain algorithms and additional  guidance. Performed at [facility], [address], [place], [place] [address]   Troponin I (High Sensitivity)     Status: None    Collection Time: 11/18/22  2:48 AM  Result Value Ref Range   Troponin I (High Sensitivity) 3 <18 ng/L    Comment: (NOTE) Elevated high sensitivity troponin I (hsTnI) values and significant  changes across serial measurements may suggest ACS but many other  chronic and acute conditions are known to elevate hsTnI results.  Refer to the "Links" section for chest pain algorithms and additional  guidance. Performed at [facility], [address], [place], [place] [address]   Lipase, blood     Status: None   Collection Time: 11/18/22  2:49 AM  Result Value Ref Range   Lipase 37 11 - 51 U/L    Comment: Performed at [facility], [address] [place], [place] [address]  Hepatic function panel     Status: Abnormal   Collection Time: 11/18/22  2:49 AM  Result Value Ref Range   Total Protein 7.2 6.5 - 8.1 g/dL   Albumin 3.9 3.5 - 5.0 g/dL   AST 295 (H) 15 - 41 U/L   ALT 259 (H) 0 - 44 U/L   Alkaline Phosphatase 98 38 - 126 U/L   Total Bilirubin 1.1 <1.2 mg/dL   Bilirubin, Direct 0.5 (H) 0.0 - 0.2 mg/dL   Indirect Bilirubin 0.6 0.3 - 0.9 mg/dL    Comment: Performed at [facility], [address] [place], [place] [address]   CT Angio Chest/Abd/Pel for Dissection W and/or Wo Contrast  Result Date: 11/18/2022 CLINICAL DATA:  Acute aortic syndrome suspected. Chest tightness radiating to left arm with nausea. EXAM: CT ANGIOGRAPHY CHEST, ABDOMEN AND PELVIS TECHNIQUE: Non-contrast CT of the chest was initially obtained. Multidetector CT imaging through the chest, abdomen and pelvis was performed using the standard protocol during bolus administration of intravenous contrast. Multiplanar reconstructed images and MIPs were obtained and reviewed to evaluate the vascular anatomy. RADIATION DOSE REDUCTION: This exam was performed according to the departmental dose-optimization program which includes automated exposure control, adjustment of the  mA and/or kV according to patient size and/or use of iterative reconstruction technique. CONTRAST:  OMNIPAQUE IOHEXOL 350 MG/ML SOLN COMPARISON:  None Available. FINDINGS: CTA CHEST FINDINGS Cardiovascular: The heart is normal in size and there is no pericardial effusion. A few scattered coronary artery calcifications are noted. The aorta and pulmonary trunk are normal in caliber. No dissection is seen. Mediastinum/Nodes: No enlarged mediastinal, hilar, or axillary lymph nodes. Thyroid gland, trachea, and esophagus demonstrate no significant findings. Lungs/Pleura: Mild atelectasis or scarring is present bilaterally. No effusion or pneumothorax. Musculoskeletal: Degenerative changes are present in the thoracic spine. No acute osseous abnormality is seen. Review  of the MIP images confirms the above findings. CTA ABDOMEN AND PELVIS FINDINGS VASCULAR Aorta: Normal caliber aorta without aneurysm, dissection, vasculitis or significant stenosis. Celiac: Patent without evidence of aneurysm, dissection, vasculitis or significant stenosis. SMA: Patent without evidence of aneurysm, dissection, vasculitis or significant stenosis. Renals: Both renal arteries are patent without evidence of aneurysm, dissection, vasculitis, fibromuscular dysplasia or significant stenosis. IMA: Patent. Inflow: Patent without evidence of aneurysm, dissection, vasculitis or significant stenosis. Veins: No obvious venous abnormality within the limitations of this arterial phase study. Review of the MIP images confirms the above findings. NON-VASCULAR Hepatobiliary: A 1.8 cm hypodensity is noted in the right lobe of the liver, possible cyst or hemangioma. Stones are present within the gallbladder. The common bile duct is distended measuring 1.1 cm. There is a 8 mm stone in the distal common bile duct at the ampulla. Pancreas: Unremarkable. No pancreatic ductal dilatation or surrounding inflammatory changes. Spleen: Normal in size without focal  abnormality. Adrenals/Urinary Tract: The adrenal glands are within normal limits. The kidneys enhance symmetrically. No renal calculus or hydronephrosis bilaterally. The bladder is unremarkable. Stomach/Bowel: Stomach is within normal limits. Appendix is surgically absent. The cecum and ileocecal junction are noted in the mid left abdomen. No evidence of bowel wall thickening, distention, or inflammatory changes. No free air or pneumatosis is seen. Lymphatic: No abdominal or pelvic lymphadenopathy. Reproductive: The uterus is not seen.  No adnexal masses. Other: No abdominopelvic ascites. A fat containing umbilical hernia is noted. Musculoskeletal: Total hip arthroplasty changes are present on the left. Sclerosis is present at the sacroiliac joints bilaterally, compatible with sacroiliitis. Degenerative changes are noted in the lumbar spine. No acute osseous abnormality. Review of the MIP images confirms the above findings. IMPRESSION: 1. No evidence of aortic aneurysm or dissection. 2. Distended common bile duct with a 8 mm stone in the distal common bile duct at the ampulla. 3. Cholelithiasis. 4. Coronary artery calcifications. Electronically Signed   By: Thornell Sartorius M.D.   On: 11/18/2022 03:46   DG Chest 2 View  Result Date: 11/18/2022 CLINICAL DATA:  Chest tightness extending to left arm with nausea. EXAM: CHEST - 2 VIEW COMPARISON:  None Available. FINDINGS: The heart size and mediastinal contours are within normal limits. No consolidation, effusion, or pneumothorax. There is dextroscoliosis of the thoracic spine. No acute osseous abnormality is seen. IMPRESSION: No active cardiopulmonary disease. Electronically Signed   By: Thornell Sartorius M.D.   On: 11/18/2022 00:16    Pending Labs Unresulted Labs (From admission, onward)    None       Vitals/Pain Today's Vitals   11/18/22 0600 11/18/22 0605 11/18/22 0607 11/18/22 0630  BP: (!) 154/91   (!) 146/84  Pulse: 75   78  Resp: 17   16  Temp:    97.8 F (36.6 C)   TempSrc:   Oral   SpO2: 100%   99%  Weight:      Height:      PainSc:  5       Isolation Precautions No active isolations  Medications Medications  oxyCODONE (Oxy IR/ROXICODONE) immediate release tablet 5 mg (has no administration in time range)  HYDROmorphone (DILAUDID) injection 0.5 mg (has no administration in time range)  polyethylene glycol (MIRALAX / GLYCOLAX) packet 17 g (has no administration in time range)  melatonin tablet 5 mg (has no administration in time range)  prochlorperazine (COMPAZINE) injection 5 mg (5 mg Intravenous Given 11/18/22 0433)  0.9 %  sodium chloride infusion (  Intravenous New Bag/Given 11/18/22 0643)  ketorolac (TORADOL) 15 MG/ML injection 15 mg (15 mg Intravenous Given 11/18/22 0235)  ondansetron (ZOFRAN) injection 4 mg (4 mg Intravenous Given 11/18/22 0247)  iohexol (OMNIPAQUE) 350 MG/ML injection 100 mL (100 mLs Intravenous Contrast Given 11/18/22 0303)  morphine (PF) 4 MG/ML injection 4 mg (4 mg Intravenous Given 11/18/22 0435)    Mobility walks with person assist     Focused Assessments Chest pain;  Choledocholithiasis   R Recommendations: See Admitting Provider Note  Report given to:   Additional Notes: .

## 2022-11-18 NOTE — ED Provider Notes (Signed)
[place] EMERGENCY DEPARTMENT AT [facility] Provider Note   CSN: [id number] Arrival date & time: 11/17/22  2306     History  Chief Complaint  Patient presents with   Chest Pain    Lindsey Sanchez is a 52 y.o. female with overall noncontributory past medical history who presents with concern for chest tightness radiating into the epigastric region that goes into left arm along with nausea that started around 2 PM today.  Patient quite anxious, hyperventilating, reporting that she "has never felt like this".  She reports that she is not having pain exactly is more some chest tightness, as well as some nausea /pain in the epigastric region.  Placed on oxygen for comfort prior to ED provider assessment.  No previous history of ACS, stroke, hypertension, diabetes, or hyperlipidemia reported by patient.   Chest Pain      Home Medications Prior to Admission medications   Medication Sig Start Date End Date Taking? Authorizing Provider  fluticasone (FLONASE) 50 MCG/ACT nasal spray Place 1 spray into both nostrils daily as needed for allergies or rhinitis.   Yes [provider]  levocetirizine (XYZAL) 5 MG tablet Take 5 mg by mouth every evening.   Yes [provider]  meloxicam (MOBIC) 15 MG tablet Take 1 tablet (15 mg total) by mouth daily as needed for pain (and inflammation). 06/27/20  Yes Gawne, Meghan M, PA-C  montelukast (SINGULAIR) 10 MG tablet Take 10 mg by mouth at bedtime.   Yes [provider]  Multiple Vitamin (MULTIVITAMIN WITH MINERALS) TABS tablet Take 1 tablet by mouth daily.   Yes [provider]  omeprazole (PRILOSEC OTC) 20 MG tablet Take 1 tablet (20 mg total) by mouth daily. For gastric protection 06/27/20 11/18/22 Yes Gawne, Meghan M, PA-C  oxyCODONE (ROXICODONE) 5 MG immediate release tablet Take 1 tablet (5 mg total) by mouth every 6 (six) hours as needed for severe pain. Do not take more than 6 tablets in a 24 hour period.  06/27/20  Yes Gawne, Meghan M, PA-C  acetaminophen (TYLENOL) 500 MG tablet Take 2 tablets (1,000 mg total) by mouth every 6 (six) hours as needed for mild pain or moderate pain. Patient not taking: Reported on 11/18/2022 06/27/20   Jenne Pane, PA-C  aspirin 81 MG chewable tablet Chew 1 tablet (81 mg total) by mouth 2 (two) times daily. To prevent blood clots after surgery Patient not taking: Reported on 11/18/2022 06/27/20   Jenne Pane, PA-C  magnesium oxide (MAG-OX) 400 MG tablet Take 400 mg by mouth 2 (two) times a week. Patient not taking: Reported on 11/18/2022    [provider]  methocarbamol (ROBAXIN) 500 MG tablet Take 1 tablet (500 mg total) by mouth every 8 (eight) hours as needed for muscle spasms. Patient not taking: Reported on 11/18/2022 06/27/20   Jenne Pane, PA-C  ondansetron (ZOFRAN) 4 MG tablet Take 1 tablet (4 mg total) by mouth daily as needed for nausea or vomiting. Patient not taking: Reported on 11/18/2022 06/27/20   Jenne Pane, PA-C      Allergies    Patient has no known allergies.    Review of Systems   Review of Systems  Cardiovascular:  Positive for chest pain.  All other systems reviewed and are negative.   Physical Exam Updated Vital Signs BP (!) 141/90   Pulse 79   Temp 98.5 F (36.9 C) (Oral)   Resp 17   Ht 6' (1.829 m)  Wt 105.7 kg   LMP 04/14/2017 (Approximate)   SpO2 100%   BMI 31.60 kg/m  Physical Exam Vitals and nursing note reviewed.  Constitutional:      General: She is not in acute distress.    Appearance: Normal appearance.  HENT:     Head: Normocephalic and atraumatic.  Eyes:     General:        Right eye: No discharge.        Left eye: No discharge.  Cardiovascular:     Rate and Rhythm: Normal rate and regular rhythm.     Heart sounds: No murmur heard.    No friction rub. No gallop.  Pulmonary:     Effort: Pulmonary effort is normal.     Breath sounds: Normal breath sounds.     Comments: Increased work  of breathing, but normal respiratory effort bilaterally, no wheezing, rhonchi, stridor, rales. Abdominal:     General: Bowel sounds are normal.     Palpations: Abdomen is soft.     Comments: Patient does have tenderness palpation in the epigastric and right upper quadrant region of the abdomen, some guarding, no rebound, rigidity.  Skin:    General: Skin is warm and dry.     Capillary Refill: Capillary refill takes less than 2 seconds.  Neurological:     Mental Status: She is alert and oriented to person, place, and time.  Psychiatric:        Mood and Affect: Mood normal.        Behavior: Behavior normal.     ED Results / Procedures / Treatments   Labs (all labs ordered are listed, but only abnormal results are displayed) Labs Reviewed  BASIC METABOLIC PANEL - Abnormal; Notable for the following components:      Result Value   Sodium 134 (*)    Glucose, Bld 141 (*)    All other components within normal limits  HEPATIC FUNCTION PANEL - Abnormal; Notable for the following components:   AST 457 (*)    ALT 259 (*)    Bilirubin, Direct 0.5 (*)    All other components within normal limits  CBC  LIPASE, BLOOD  TROPONIN I (HIGH SENSITIVITY)  TROPONIN I (HIGH SENSITIVITY)    EKG EKG Interpretation Date/Time:  Tuesday November 17 2022 23:15:00 EST Ventricular Rate:  95 PR Interval:  144 QRS Duration:  91 QT Interval:  339 QTC Calculation: 427 R Axis:   44  Text Interpretation: Sinus rhythm Low voltage, precordial leads Borderline T abnormalities, anterior leads No previous ECGs available Confirmed by Zadie Rhine ([id number]) on 11/18/2022 2:05:39 AM  Radiology CT Angio Chest/Abd/Pel for Dissection W and/or Wo Contrast  Result Date: 11/18/2022 CLINICAL DATA:  Acute aortic syndrome suspected. Chest tightness radiating to left arm with nausea. EXAM: CT ANGIOGRAPHY CHEST, ABDOMEN AND PELVIS TECHNIQUE: Non-contrast CT of the chest was initially obtained. Multidetector CT imaging  through the chest, abdomen and pelvis was performed using the standard protocol during bolus administration of intravenous contrast. Multiplanar reconstructed images and MIPs were obtained and reviewed to evaluate the vascular anatomy. RADIATION DOSE REDUCTION: This exam was performed according to the departmental dose-optimization program which includes automated exposure control, adjustment of the mA and/or kV according to patient size and/or use of iterative reconstruction technique. CONTRAST:  OMNIPAQUE IOHEXOL 350 MG/ML SOLN COMPARISON:  None Available. FINDINGS: CTA CHEST FINDINGS Cardiovascular: The heart is normal in size and there is no pericardial effusion. A few scattered coronary artery calcifications are  noted. The aorta and pulmonary trunk are normal in caliber. No dissection is seen. Mediastinum/Nodes: No enlarged mediastinal, hilar, or axillary lymph nodes. Thyroid gland, trachea, and esophagus demonstrate no significant findings. Lungs/Pleura: Mild atelectasis or scarring is present bilaterally. No effusion or pneumothorax. Musculoskeletal: Degenerative changes are present in the thoracic spine. No acute osseous abnormality is seen. Review of the MIP images confirms the above findings. CTA ABDOMEN AND PELVIS FINDINGS VASCULAR Aorta: Normal caliber aorta without aneurysm, dissection, vasculitis or significant stenosis. Celiac: Patent without evidence of aneurysm, dissection, vasculitis or significant stenosis. SMA: Patent without evidence of aneurysm, dissection, vasculitis or significant stenosis. Renals: Both renal arteries are patent without evidence of aneurysm, dissection, vasculitis, fibromuscular dysplasia or significant stenosis. IMA: Patent. Inflow: Patent without evidence of aneurysm, dissection, vasculitis or significant stenosis. Veins: No obvious venous abnormality within the limitations of this arterial phase study. Review of the MIP images confirms the above findings.  NON-VASCULAR Hepatobiliary: A 1.8 cm hypodensity is noted in the right lobe of the liver, possible cyst or hemangioma. Stones are present within the gallbladder. The common bile duct is distended measuring 1.1 cm. There is a 8 mm stone in the distal common bile duct at the ampulla. Pancreas: Unremarkable. No pancreatic ductal dilatation or surrounding inflammatory changes. Spleen: Normal in size without focal abnormality. Adrenals/Urinary Tract: The adrenal glands are within normal limits. The kidneys enhance symmetrically. No renal calculus or hydronephrosis bilaterally. The bladder is unremarkable. Stomach/Bowel: Stomach is within normal limits. Appendix is surgically absent. The cecum and ileocecal junction are noted in the mid left abdomen. No evidence of bowel wall thickening, distention, or inflammatory changes. No free air or pneumatosis is seen. Lymphatic: No abdominal or pelvic lymphadenopathy. Reproductive: The uterus is not seen.  No adnexal masses. Other: No abdominopelvic ascites. A fat containing umbilical hernia is noted. Musculoskeletal: Total hip arthroplasty changes are present on the left. Sclerosis is present at the sacroiliac joints bilaterally, compatible with sacroiliitis. Degenerative changes are noted in the lumbar spine. No acute osseous abnormality. Review of the MIP images confirms the above findings. IMPRESSION: 1. No evidence of aortic aneurysm or dissection. 2. Distended common bile duct with a 8 mm stone in the distal common bile duct at the ampulla. 3. Cholelithiasis. 4. Coronary artery calcifications. Electronically Signed   By: Thornell Sartorius M.D.   On: 11/18/2022 03:46   DG Chest 2 View  Result Date: 11/18/2022 CLINICAL DATA:  Chest tightness extending to left arm with nausea. EXAM: CHEST - 2 VIEW COMPARISON:  None Available. FINDINGS: The heart size and mediastinal contours are within normal limits. No consolidation, effusion, or pneumothorax. There is dextroscoliosis of the  thoracic spine. No acute osseous abnormality is seen. IMPRESSION: No active cardiopulmonary disease. Electronically Signed   By: Thornell Sartorius M.D.   On: 11/18/2022 00:16    Procedures Procedures    Medications Ordered in ED Medications  oxyCODONE (Oxy IR/ROXICODONE) immediate release tablet 5 mg (has no administration in time range)  HYDROmorphone (DILAUDID) injection 0.5 mg (has no administration in time range)  polyethylene glycol (MIRALAX / GLYCOLAX) packet 17 g (has no administration in time range)  melatonin tablet 5 mg (has no administration in time range)  prochlorperazine (COMPAZINE) injection 5 mg (5 mg Intravenous Given 11/18/22 0433)  0.9 %  sodium chloride infusion (has no administration in time range)  ketorolac (TORADOL) 15 MG/ML injection 15 mg (15 mg Intravenous Given 11/18/22 0235)  ondansetron (ZOFRAN) injection 4 mg (4 mg Intravenous Given 11/18/22  0247)  iohexol (OMNIPAQUE) 350 MG/ML injection 100 mL (100 mLs Intravenous Contrast Given 11/18/22 0303)  morphine (PF) 4 MG/ML injection 4 mg (4 mg Intravenous Given 11/18/22 0435)    ED Course/ Medical Decision Making/ A&P                                 Medical Decision Making Amount and/or Complexity of Data Reviewed Labs: ordered. Radiology: ordered.  Risk OTC drugs. Prescription drug management. Decision regarding hospitalization.   This patient is a 51 y.o. female who presents to the ED for concern of abdominal pain with chest pain and shortness of breath, this involves an extensive number of treatment options, and is a complaint that carries with it a high risk of complications and morbidity. The emergent differential diagnosis prior to evaluation includes, but is not limited to,  ACS, AAS, PE, Mallory-Weiss, Boerhaave's, Pneumonia, acute bronchitis, asthma or COPD exacerbation, anxiety, MSK pain or traumatic injury to the chest, acid reflux, The causes of generalized abdominal pain include but are not limited to  AAA, mesenteric ischemia, appendicitis, diverticulitis, DKA, gastritis, gastroenteritis, AMI, nephrolithiasis, pancreatitis, peritonitis, adrenal insufficiency,lead poisoning, iron toxicity, intestinal ischemia, constipation, UTI,SBO/LBO, splenic rupture, biliary disease, IBD, IBS, PUD, or hepatitis . This is not an exhaustive differential.   Past Medical History / Co-morbidities / Social History: Overall noncontributory  Physical Exam: Physical exam performed. The pertinent findings include: Increased work of breathing, but normal respiratory effort bilaterally, no wheezing, rhonchi, stridor, rales. Patient does have tenderness palpation in the epigastric and right upper quadrant region of the abdomen, some guarding, no rebound, rigidity.   Lab Tests: I ordered, and personally interpreted labs.  The pertinent results include: BMP notable for mild hyponatremia, sodium 134, glucose 141.  CBC unremarkable, hepatic function panel is notable for elevated AST, ALT, total bilirubin within normal range however although direct bilirubin is somewhat elevated.  Normal lipase, normal troponin   Imaging Studies: I ordered imaging studies including CT angio chest abdomen pelvis for dissection, plain film chest x-ray. I independently visualized and interpreted imaging which showed 8 mm stone blocking common bile duct, cholelithiasis, consistent with acute choledocholithiasis. I agree with the radiologist interpretation.   Cardiac Monitoring:  The patient was maintained on a cardiac monitor.  My attending physician Dr. Bebe Shaggy viewed and interpreted the cardiac monitored which showed an underlying rhythm of: NSR. I agree with this interpretation.   Medications: I ordered medication including morphine, Toradol for pain, Zofran for nausea.  Consultations Obtained: I requested consultation with the GI physician, Dr. Pati Gallo who agrees that patient may require ERCP and recommends medicine admission at this time.   Spoke with Dr. Margo Aye who agrees to medical admission at this time after discussion of relevant lab work and imaging findings.   Disposition: After consideration of the diagnostic results and the patients response to treatment, I feel that patient would benefit from admission for acute choledocholithiasis.   I discussed this case with my attending physician Dr. Bebe Shaggy who cosigned this note including patient's presenting symptoms, physical exam, and planned diagnostics and interventions. Attending physician stated agreement with plan or made changes to plan which were implemented.    Final Clinical Impression(s) / ED Diagnoses Final diagnoses:  Choledocholithiasis    Rx / DC Orders ED Discharge Orders     None         [place] 11/18/22 1610    Zadie Rhine,  MD 11/18/22 0540

## 2022-11-18 NOTE — Plan of Care (Signed)

## 2022-11-19 ENCOUNTER — Inpatient Hospital Stay (HOSPITAL_COMMUNITY): Payer: Medicaid Other

## 2022-11-19 ENCOUNTER — Inpatient Hospital Stay (HOSPITAL_COMMUNITY): Payer: Medicaid Other | Admitting: Anesthesiology

## 2022-11-19 ENCOUNTER — Encounter (HOSPITAL_COMMUNITY)
Admission: EM | Disposition: A | Discharge status: Home / Self Care | Payer: Self-pay | Source: Home / Self Care | Attending: Internal Medicine

## 2022-11-19 ENCOUNTER — Encounter (HOSPITAL_COMMUNITY): Payer: Self-pay | Admitting: Internal Medicine

## 2022-11-19 DIAGNOSIS — E669 Obesity, unspecified: Secondary | ICD-10-CM

## 2022-11-19 DIAGNOSIS — R7401 Elevation of levels of liver transaminase levels: Secondary | ICD-10-CM | POA: Diagnosis not present

## 2022-11-19 DIAGNOSIS — K8050 Calculus of bile duct without cholangitis or cholecystitis without obstruction: Secondary | ICD-10-CM | POA: Diagnosis not present

## 2022-11-19 HISTORY — PX: REMOVAL OF STONES: SHX5545

## 2022-11-19 HISTORY — PX: ERCP: SHX5425

## 2022-11-19 HISTORY — PX: SPHINCTEROTOMY: SHX5544

## 2022-11-19 LAB — CBC
HCT: 37.6 % (ref 36.0–46.0)
Hemoglobin: 12.6 g/dL (ref 12.0–15.0)
MCH: 32.2 pg (ref 26.0–34.0)
MCHC: 33.5 g/dL (ref 30.0–36.0)
MCV: 96.2 fL (ref 80.0–100.0)
Platelets: 281 10*3/uL (ref 150–400)
RBC: 3.91 MIL/uL (ref 3.87–5.11)
RDW: 13.3 % (ref 11.5–15.5)
WBC: 3.7 10*3/uL — ABNORMAL LOW (ref 4.0–10.5)
nRBC: 0 % (ref 0.0–0.2)

## 2022-11-19 LAB — COMPREHENSIVE METABOLIC PANEL
ALT: 499 U/L — ABNORMAL HIGH (ref 0–44)
AST: 274 U/L — ABNORMAL HIGH (ref 15–41)
Albumin: 3.3 g/dL — ABNORMAL LOW (ref 3.5–5.0)
Alkaline Phosphatase: 105 U/L (ref 38–126)
Anion gap: 6 (ref 5–15)
BUN: <5 mg/dL — ABNORMAL LOW (ref 6–20)
CO2: 24 mmol/L (ref 22–32)
Calcium: 8.6 mg/dL — ABNORMAL LOW (ref 8.9–10.3)
Chloride: 103 mmol/L (ref 98–111)
Creatinine, Ser: 0.44 mg/dL (ref 0.44–1.00)
GFR, Estimated: >60 mL/min (ref >60)
Glucose, Bld: 98 mg/dL (ref 70–99)
Potassium: 3.7 mmol/L (ref 3.5–5.1)
Sodium: 133 mmol/L — ABNORMAL LOW (ref 135–145)
Total Bilirubin: 1.7 mg/dL — ABNORMAL HIGH (ref <1.2)
Total Protein: 6.2 g/dL — ABNORMAL LOW (ref 6.5–8.1)

## 2022-11-19 LAB — HIV ANTIBODY (ROUTINE TESTING W REFLEX): HIV Screen 4th Generation wRfx: NONREACTIVE

## 2022-11-19 SURGERY — ERCP, WITH INTERVENTION IF INDICATED
Anesthesia: General

## 2022-11-19 MED ORDER — DICLOFENAC SUPPOSITORY 100 MG
RECTAL | Status: DC | PRN
  Administered 2022-11-19: 100 mg via RECTAL

## 2022-11-19 MED ORDER — CIPROFLOXACIN IN D5W 400 MG/200ML IV SOLN
400.0000 mg | Freq: Once | INTRAVENOUS | Status: AC
  Administered 2022-11-19: 400 mg via INTRAVENOUS

## 2022-11-19 MED ORDER — ROCURONIUM BROMIDE 10 MG/ML (PF) SYRINGE
PREFILLED_SYRINGE | INTRAVENOUS | Status: DC | PRN
  Administered 2022-11-19: 60 mg via INTRAVENOUS

## 2022-11-19 MED ORDER — DEXAMETHASONE SODIUM PHOSPHATE 10 MG/ML IJ SOLN
INTRAMUSCULAR | Status: DC | PRN
  Administered 2022-11-19: 5 mg via INTRAVENOUS

## 2022-11-19 MED ORDER — CEFAZOLIN SODIUM-DEXTROSE 2-4 GM/100ML-% IV SOLN
2.0000 g | Freq: Once | INTRAVENOUS | Status: AC
  Administered 2022-11-20: 2 g via INTRAVENOUS
  Filled 2022-11-19: qty 100

## 2022-11-19 MED ORDER — SUGAMMADEX SODIUM 200 MG/2ML IV SOLN
INTRAVENOUS | Status: DC | PRN
  Administered 2022-11-19: 300 mg via INTRAVENOUS

## 2022-11-19 MED ORDER — MIDAZOLAM HCL 2 MG/2ML IJ SOLN
INTRAMUSCULAR | Status: DC | PRN
  Administered 2022-11-19: 2 mg via INTRAVENOUS

## 2022-11-19 MED ORDER — PROPOFOL 10 MG/ML IV BOLUS
INTRAVENOUS | Status: DC | PRN
  Administered 2022-11-19: 200 mg via INTRAVENOUS

## 2022-11-19 MED ORDER — ONDANSETRON HCL 4 MG/2ML IJ SOLN
INTRAMUSCULAR | Status: DC | PRN
  Administered 2022-11-19: 4 mg via INTRAVENOUS

## 2022-11-19 MED ORDER — SODIUM CHLORIDE 0.9 % IV SOLN: INTRAVENOUS | Status: DC | PRN

## 2022-11-19 MED ORDER — CIPROFLOXACIN IN D5W 400 MG/200ML IV SOLN
INTRAVENOUS | Status: AC
  Filled 2022-11-19: qty 200

## 2022-11-19 MED ORDER — MIDAZOLAM HCL 2 MG/2ML IJ SOLN
INTRAMUSCULAR | Status: AC
  Filled 2022-11-19: qty 2

## 2022-11-19 MED ORDER — FENTANYL CITRATE (PF) 100 MCG/2ML IJ SOLN
INTRAMUSCULAR | Status: AC
  Filled 2022-11-19: qty 2

## 2022-11-19 MED ORDER — PHENOL 1.4 % MT LIQD
1.0000 | OROMUCOSAL | Status: DC | PRN
  Administered 2022-11-19: 1 via OROMUCOSAL
  Filled 2022-11-19: qty 177

## 2022-11-19 MED ORDER — FENTANYL CITRATE (PF) 250 MCG/5ML IJ SOLN
INTRAMUSCULAR | Status: DC | PRN
  Administered 2022-11-19: 100 ug via INTRAVENOUS
  Administered 2022-11-19 (×2): 50 ug via INTRAVENOUS

## 2022-11-19 MED ORDER — LIDOCAINE 2% (20 MG/ML) 5 ML SYRINGE
INTRAMUSCULAR | Status: DC | PRN
  Administered 2022-11-19: 60 mg via INTRAVENOUS

## 2022-11-19 MED ORDER — PROPOFOL 10 MG/ML IV BOLUS
INTRAVENOUS | Status: AC
  Filled 2022-11-19: qty 20

## 2022-11-19 MED ORDER — SODIUM CHLORIDE 0.9 % IV SOLN
INTRAVENOUS | Status: DC | PRN
  Administered 2022-11-19: 10 mL

## 2022-11-19 MED ORDER — DICLOFENAC SUPPOSITORY 100 MG
RECTAL | Status: AC
  Filled 2022-11-19: qty 1

## 2022-11-19 NOTE — Progress Notes (Signed)
[facility]                                                                              Darlina Mccaughey, is a 52 y.o. female, DOB - Sep 21, 1970, UUV:[id number] Admit date - 11/17/2022    Outpatient Primary MD for the patient is Osei-Bonsu, Greggory Stallion, MD  LOS - 1  days  Chief Complaint  Patient presents with   Chest Pain       Brief summary    Patient is a 52 year old female with prior history of hip arthroplasty, appendectomy, hysterectomy presented with upper abdominal pain, nausea that started a day before the admission.  No fevers or vomiting.  No chest pain or shortness of breath in ED. CMET showed elevated LFTs, lipase normal 37 CT chest abdomen pelvis showed distended common bile duct with 8 mm stone in the distal CBD at the ampulla, cholelithiasis.  GI was consulted.  Assessment & Plan    Principal Problem: Cholelithiasis and choledocholithiasis -CT imaging showed distended CBD with 8 mm stone in the distal CBD at the ampulla, cholelithiasis -Continue n.p.o. status, pain control, antiemetics -GI consulted, plan for ERCP today -General Surgery also consulted, may need cholecystectomy  Active Problems:   Transaminitis -Likely due to #1    Obesity (BMI 30-39.9) Estimated body mass index is 31.6 kg/m as calculated from the following:   Height as of this encounter: 6' (1.829 m).   Weight as of this encounter: 105.7 kg.  Code Status: Full CODE STATUS DVT Prophylaxis:  enoxaparin (LOVENOX) injection 40 mg Start: 11/18/22 0830 SCDs Start: 11/18/22 0818   Level of Care: Level of care: Med-Surg Family Communication: Updated patient Disposition Plan:      Remains inpatient appropriate: None   Procedures:  CT chest abdomen pelvis  Consultants:   GI General Surgery  Antimicrobials:   Anti-infectives (From admission, onward)    None          Medications  enoxaparin (LOVENOX) injection  40 mg Subcutaneous Q24H   montelukast  10 mg Oral QHS       Subjective:   Golden Emile was seen and examined today.  Still has pain in the abdomen, no active nausea or vomiting or fevers.  No chest pain, shortness of breath.  Awaiting ERCP today.    Objective:   Vitals:   11/18/22 2115 11/19/22 0143 11/19/22 0545 11/19/22 1000  BP: (!) 146/86 129/81 (!) 145/93 132/72  Pulse: 73 64 70 72  Resp: 18 18 18 18   Temp: 98.5 F (36.9 C) 97.8 F (36.6 C) 98.1 F (36.7 C) 98.1 F (36.7 C)  TempSrc:    Oral  SpO2: 100% 99% 100% 99%  Weight:      Height:        Intake/Output Summary (Last 24 hours) at 11/19/2022 1129 Last data filed at 11/19/2022 1000 Gross per 24 hour  Intake 464.17 ml  Output 1500 ml  Net -1035.83 ml     Wt Readings from Last 3 Encounters:  11/17/22 105.7 kg  06/25/20 105.7 kg  06/17/20 105.7 kg     Exam General: Alert and oriented x 3, NAD Cardiovascular: S1  S2 auscultated,  RRR Respiratory: Clear to auscultation bilaterally, no wheezing, rales or rhonchi Gastrointestinal: Soft, mild TTP in the LLQ, no rebound or guarding, NBS  Ext: no pedal edema bilaterally Neuro: no new deficits psych: Normal affect     Data Reviewed:  I have personally reviewed following labs    CBC Lab Results  Component Value Date   WBC 3.7 (L) 11/19/2022   RBC 3.91 11/19/2022   HGB 12.6 11/19/2022   HCT 37.6 11/19/2022   MCV 96.2 11/19/2022   MCH 32.2 11/19/2022   PLT 281 11/19/2022   MCHC 33.5 11/19/2022   RDW 13.3 11/19/2022   LYMPHSABS 1.8 03/18/2009   MONOABS 0.4 03/18/2009   EOSABS 0.4 03/18/2009   BASOSABS 0.0 03/18/2009     Last metabolic panel Lab Results  Component Value Date   NA 133 (L) 11/19/2022   K 3.7 11/19/2022   CL 103 11/19/2022   CO2 24 11/19/2022   BUN <5 (L) 11/19/2022   CREATININE 0.44 11/19/2022   GLUCOSE 98 11/19/2022   GFRNONAA >60 11/19/2022   CALCIUM 8.6 (L) 11/19/2022   PROT 6.2 (L) 11/19/2022   ALBUMIN 3.3 (L) 11/19/2022   BILITOT 1.7 (H) 11/19/2022   ALKPHOS 105  11/19/2022   AST 274 (H) 11/19/2022   ALT 499 (H) 11/19/2022   ANIONGAP 6 11/19/2022    CBG (last 3)  No results for input(s): "GLUCAP" in the last 72 hours.    Coagulation Profile: No results for input(s): "INR", "PROTIME" in the last 168 hours.   Radiology Studies: I have personally reviewed the imaging studies  CT Angio Chest/Abd/Pel for Dissection W and/or Wo Contrast  Result Date: 11/18/2022 CLINICAL DATA:  Acute aortic syndrome suspected. Chest tightness radiating to left arm with nausea. EXAM: CT ANGIOGRAPHY CHEST, ABDOMEN AND PELVIS TECHNIQUE: Non-contrast CT of the chest was initially obtained. Multidetector CT imaging through the chest, abdomen and pelvis was performed using the standard protocol during bolus administration of intravenous contrast. Multiplanar reconstructed images and MIPs were obtained and reviewed to evaluate the vascular anatomy. RADIATION DOSE REDUCTION: This exam was performed according to the departmental dose-optimization program which includes automated exposure control, adjustment of the mA and/or kV according to patient size and/or use of iterative reconstruction technique. CONTRAST:  OMNIPAQUE IOHEXOL 350 MG/ML SOLN COMPARISON:  None Available. FINDINGS: CTA CHEST FINDINGS Cardiovascular: The heart is normal in size and there is no pericardial effusion. A few scattered coronary artery calcifications are noted. The aorta and pulmonary trunk are normal in caliber. No dissection is seen. Mediastinum/Nodes: No enlarged mediastinal, hilar, or axillary lymph nodes. Thyroid gland, trachea, and esophagus demonstrate no significant findings. Lungs/Pleura: Mild atelectasis or scarring is present bilaterally. No effusion or pneumothorax. Musculoskeletal: Degenerative changes are present in the thoracic spine. No acute osseous abnormality is seen. Review of the MIP images confirms the above findings. CTA ABDOMEN AND PELVIS FINDINGS VASCULAR Aorta: Normal caliber aorta  without aneurysm, dissection, vasculitis or significant stenosis. Celiac: Patent without evidence of aneurysm, dissection, vasculitis or significant stenosis. SMA: Patent without evidence of aneurysm, dissection, vasculitis or significant stenosis. Renals: Both renal arteries are patent without evidence of aneurysm, dissection, vasculitis, fibromuscular dysplasia or significant stenosis. IMA: Patent. Inflow: Patent without evidence of aneurysm, dissection, vasculitis or significant stenosis. Veins: No obvious venous abnormality within the limitations of this arterial phase study. Review of the MIP images confirms the above findings. NON-VASCULAR Hepatobiliary: A 1.8 cm hypodensity is noted in the right lobe of the liver, possible  cyst or hemangioma. Stones are present within the gallbladder. The common bile duct is distended measuring 1.1 cm. There is a 8 mm stone in the distal common bile duct at the ampulla. Pancreas: Unremarkable. No pancreatic ductal dilatation or surrounding inflammatory changes. Spleen: Normal in size without focal abnormality. Adrenals/Urinary Tract: The adrenal glands are within normal limits. The kidneys enhance symmetrically. No renal calculus or hydronephrosis bilaterally. The bladder is unremarkable. Stomach/Bowel: Stomach is within normal limits. Appendix is surgically absent. The cecum and ileocecal junction are noted in the mid left abdomen. No evidence of bowel wall thickening, distention, or inflammatory changes. No free air or pneumatosis is seen. Lymphatic: No abdominal or pelvic lymphadenopathy. Reproductive: The uterus is not seen.  No adnexal masses. Other: No abdominopelvic ascites. A fat containing umbilical hernia is noted. Musculoskeletal: Total hip arthroplasty changes are present on the left. Sclerosis is present at the sacroiliac joints bilaterally, compatible with sacroiliitis. Degenerative changes are noted in the lumbar spine. No acute osseous abnormality. Review of  the MIP images confirms the above findings. IMPRESSION: 1. No evidence of aortic aneurysm or dissection. 2. Distended common bile duct with a 8 mm stone in the distal common bile duct at the ampulla. 3. Cholelithiasis. 4. Coronary artery calcifications. Electronically Signed   By: Thornell Sartorius M.D.   On: 11/18/2022 03:46   DG Chest 2 View  Result Date: 11/18/2022 CLINICAL DATA:  Chest tightness extending to left arm with nausea. EXAM: CHEST - 2 VIEW COMPARISON:  None Available. FINDINGS: The heart size and mediastinal contours are within normal limits. No consolidation, effusion, or pneumothorax. There is dextroscoliosis of the thoracic spine. No acute osseous abnormality is seen. IMPRESSION: No active cardiopulmonary disease. Electronically Signed   By: Thornell Sartorius M.D.   On: 11/18/2022 00:16       Cason Luffman M.D. [facility] 11/19/2022, 11:29 AM  Available via Epic secure chat 7am-7pm After 7 pm, please refer to night coverage provider listed on amion.

## 2022-11-19 NOTE — Progress Notes (Signed)
Lindsey Sanchez 12:36 PM  Subjective: Patient doing well with much less pain and we rediscussed the procedure and answered all of her questions  Objective: Vital signs stable afebrile no acute distress abdomen is soft nontender rest of exam see a preassessment evaluation LFTs overall about the same white count okay  Assessment: Gallstones and CBD stones  Plan: Okay to proceed with ERCP with anesthesia assistance and await surgical consult which we discussed  [facility]  office [phone] After 5PM or if no answer call [phone]

## 2022-11-19 NOTE — Anesthesia Preprocedure Evaluation (Addendum)
Anesthesia Evaluation  Patient identified by MRN, date of birth, ID band Patient awake    Reviewed: Allergy & Precautions, NPO status , Patient's Chart, lab work & pertinent test results  Airway Mallampati: III  TM Distance: >3 FB Neck ROM: Full    Dental  (+) Teeth Intact, Dental Advisory Given   Pulmonary neg pulmonary ROS   Pulmonary exam normal breath sounds clear to auscultation       Cardiovascular hypertension (149/88, no home meds), Normal cardiovascular exam Rhythm:Regular Rate:Normal     Neuro/Psych negative neurological ROS  negative psych ROS   GI/Hepatic Neg liver ROS,,,CBD stones   Endo/Other  diabetes (prediabetic)  Obesity BMI 32  Renal/GU negative Renal ROS  negative genitourinary   Musculoskeletal negative musculoskeletal ROS (+)    Abdominal  (+) + obese  Peds  Hematology negative hematology ROS (+)   Anesthesia Other Findings   Reproductive/Obstetrics negative OB ROS                             Anesthesia Physical Anesthesia Plan  ASA: 2  Anesthesia Plan: General   Post-op Pain Management:    Induction: Intravenous  PONV Risk Score and Plan: 3 and Ondansetron, Dexamethasone and Treatment may vary due to age or medical condition  Airway Management Planned: Oral ETT  Additional Equipment: None  Intra-op Plan:   Post-operative Plan: Extubation in OR  Informed Consent: I have reviewed the patients History and Physical, chart, labs and discussed the procedure including the risks, benefits and alternatives for the proposed anesthesia with the patient or authorized representative who has indicated his/her understanding and acceptance.     Dental advisory given  Plan Discussed with: CRNA  Anesthesia Plan Comments:        Anesthesia Quick Evaluation

## 2022-11-19 NOTE — Consult Note (Signed)
Consult Note  Lindsey Sanchez 05-10-1970  [id number].    Requesting MD: Dr. Isidoro Donning Chief Complaint/Reason for Consult: cholelithiasis, choledocholithiasis  HPI:  53 y.o. female with medical history significant for pre diabetes who presented to [facility] ED 11/6 with abdominal pain and nausea. Pain began the day prior to presentation.   Work up in ED significant for elevated LFTs and hyperbilirubinemia with choledocholithiasis on CT. GI consulted and she has undergone ERCP today.  Some sludge was removed.    Substance use: occasional etoh Allergies: nkda Blood thinners: none Past Surgeries: abdominal hysterectomy. 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 She has never used smokeless tobacco. She reports current alcohol use. She reports that she does not use drugs.  Allergies: No Known Allergies  Medications Prior to Admission  Medication Sig Dispense Refill   fluticasone (FLONASE) 50 MCG/ACT nasal spray Place 1 spray into both nostrils daily as needed for allergies or rhinitis.     levocetirizine (XYZAL) 5 MG tablet Take 5 mg by mouth every evening.     meloxicam (MOBIC) 15 MG tablet Take 1 tablet (15 mg total) by mouth daily as needed for pain (and inflammation). 30 tablet 0   montelukast (SINGULAIR) 10 MG tablet Take 10 mg by mouth at bedtime.     Multiple Vitamin (MULTIVITAMIN WITH MINERALS) TABS tablet Take 1 tablet by mouth  daily.     omeprazole (PRILOSEC OTC) 20 MG tablet Take 1 tablet (20 mg total) by mouth daily. For gastric protection 30 tablet 0   oxyCODONE (ROXICODONE) 5 MG immediate release tablet Take 1 tablet (5 mg total) by mouth every 6 (six) hours as needed for severe pain. Do not take more than 6 tablets in a 24 hour period. 28 tablet 0   acetaminophen (TYLENOL) 500 MG tablet Take 2 tablets (1,000 mg total) by mouth every 6 (six) hours as needed for mild pain or moderate pain. (Patient not taking: Reported on 11/18/2022) 60 tablet 0   aspirin 81 MG chewable tablet Chew 1 tablet (81 mg total) by mouth 2 (two) times daily. To prevent blood clots after surgery (Patient not taking: Reported on 11/18/2022) 60 tablet 0   magnesium oxide (MAG-OX) 400 MG tablet Take 400 mg by mouth 2 (two) times a week. (Patient not taking: Reported on 11/18/2022)     methocarbamol (ROBAXIN) 500 MG tablet Take 1 tablet (500 mg total) by mouth every 8 (eight) hours as needed for muscle spasms. (Patient not taking: Reported on 11/18/2022) 20 tablet 0   ondansetron (ZOFRAN) 4 MG tablet Take 1 tablet (4 mg total) by mouth daily as needed for nausea or vomiting. (Patient not taking: Reported on 11/18/2022) 10 tablet 0    Blood pressure 132/72, pulse 72, temperature 98.1 F (36.7 C), temperature source Oral, resp. rate 18, height 6' (1.829 m), weight 105.7 kg,  last menstrual period 04/14/2017, SpO2 99%.  Constitutional:  WDWN in NAD, conversant, no obvious deformities; lying in bed comfortably Eyes:  Pupils equal, round; sclera anicteric; moist conjunctiva; no lid lag HENT:  Oral mucosa moist; good dentition  Neck:  No masses palpated, trachea midline; no thyromegaly Lungs:  CTA bilaterally; normal respiratory effort CV:  Regular rate and rhythm; no murmurs; extremities well-perfused with no edema Abd:  +bowel sounds, soft, mild abdominal tenderness; no palpable organomegaly; no palpable hernias Musc:  Unable to assess gait; no apparent  clubbing or cyanosis in extremities Lymphatic:  No palpable cervical or axillary lymphadenopathy Skin:  Warm, dry; no sign of jaundice Psychiatric - alert and oriented x 4; calm mood and affect    Results for orders placed or performed during the hospital encounter of 11/17/22 (from the past 48 hour(s))  Basic metabolic panel     Status: Abnormal   Collection Time: 11/17/22 11:17 PM  Result Value Ref Range   Sodium 134 (L) 135 - 145 mmol/L   Potassium 3.6 3.5 - 5.1 mmol/L   Chloride 100 98 - 111 mmol/L   CO2 26 22 - 32 mmol/L   Glucose, Bld 141 (H) 70 - 99 mg/dL    Comment: Glucose reference range applies only to samples taken after fasting for at least 8 hours.   BUN 11 6 - 20 mg/dL   Creatinine, Ser 5.28 0.44 - 1.00 mg/dL   Calcium 9.4 8.9 - 41.3 mg/dL   GFR, Estimated >24 >40 mL/min    Comment: (NOTE) Calculated using the CKD-EPI Creatinine Equation (2021)    Anion gap 8 5 - 15    Comment: Performed at [facility], [address], [place], [place] [address]  CBC     Status: None   Collection Time: 11/17/22 11:17 PM  Result Value Ref Range   WBC 7.6 4.0 - 10.5 K/uL   RBC 4.29 3.87 - 5.11 MIL/uL   Hemoglobin 13.9 12.0 - 15.0 g/dL   HCT 53.6 64.4 - 03.4 %   MCV 95.3 80.0 - 100.0 fL   MCH 32.4 26.0 - 34.0 pg   MCHC 34.0 30.0 - 36.0 g/dL   RDW 74.2 59.5 - 63.8 %   Platelets 302 150 - 400 K/uL   nRBC 0.0 0.0 - 0.2 %    Comment: Performed at [facility], [address], [place], [place] [address]  Troponin I (High Sensitivity)     Status: None   Collection Time: 11/17/22 11:17 PM  Result Value Ref Range   Troponin I (High Sensitivity) 3 <18 ng/L    Comment: (NOTE) Elevated high sensitivity troponin I (hsTnI) values and significant  changes across serial measurements may suggest ACS but many other  chronic and acute conditions are known to elevate hsTnI results.  Refer to the "Links" section for chest pain algorithms and additional   guidance. Performed at [facility], [address] [place], [place] [address]   Troponin I (High Sensitivity)     Status: None   Collection Time: 11/18/22  2:48 AM  Result Value Ref Range   Troponin I (High Sensitivity) 3 <18 ng/L    Comment: (NOTE) Elevated high sensitivity troponin I (hsTnI) values and significant  changes across serial measurements may suggest ACS but many other  chronic and acute conditions are known to elevate hsTnI results.  Refer to the "Links" section for chest pain algorithms and additional  guidance. Performed at [facility], [address] [place], [place] [address]   Lipase, blood     Status: None   Collection Time: 11/18/22  2:49 AM  Result Value Ref Range   Lipase 37 11 - 51 U/L    Comment: Performed at [facility], [address] [place], [place] [address]  Hepatic function panel     Status: Abnormal   Collection Time: 11/18/22  2:49 AM  Result Value Ref Range   Total Protein 7.2 6.5 - 8.1 g/dL   Albumin 3.9 3.5 - 5.0 g/dL   AST 098 (H) 15 - 41 U/L   ALT 259 (H) 0 - 44 U/L   Alkaline Phosphatase 98 38 - 126 U/L   Total Bilirubin 1.1 <1.2 mg/dL   Bilirubin, Direct 0.5 (H) 0.0 - 0.2 mg/dL   Indirect Bilirubin 0.6 0.3 - 0.9 mg/dL    Comment: Performed at [facility], [address], [place], [place] [address]  Comprehensive metabolic panel     Status: Abnormal   Collection Time: 11/19/22  4:50 AM  Result Value Ref Range   Sodium 133 (L) 135 - 145 mmol/L   Potassium 3.7 3.5 - 5.1 mmol/L   Chloride 103 98 - 111 mmol/L   CO2 24 22 - 32 mmol/L   Glucose, Bld 98 70 - 99 mg/dL    Comment: Glucose reference range applies only to samples taken after fasting for at least 8 hours.   BUN <5 (L) 6 - 20 mg/dL   Creatinine, Ser 7.82 0.44 - 1.00 mg/dL   Calcium 8.6 (L) 8.9 - 10.3 mg/dL   Total Protein 6.2 (L) 6.5 - 8.1 g/dL   Albumin 3.3 (L) 3.5 - 5.0 g/dL   AST 956 (H)  15 - 41 U/L   ALT 499 (H) 0 - 44 U/L   Alkaline Phosphatase 105 38 - 126 U/L   Total Bilirubin 1.7 (H) <1.2 mg/dL   GFR, Estimated >21 >30 mL/min    Comment: (NOTE) Calculated using the CKD-EPI Creatinine Equation (2021)    Anion gap 6 5 - 15    Comment: Performed at [facility], [address], [place], [place] [address]  CBC     Status: Abnormal   Collection Time: 11/19/22  4:50 AM  Result Value Ref Range   WBC 3.7 (L) 4.0 - 10.5 K/uL   RBC 3.91 3.87 - 5.11 MIL/uL   Hemoglobin 12.6 12.0 - 15.0 g/dL   HCT 46.9 62.9 - 52.8 %   MCV 96.2 80.0 - 100.0 fL   MCH 32.2 26.0 - 34.0 pg   MCHC 33.5 30.0 - 36.0 g/dL   RDW 41.3 24.4 - 01.0 %   Platelets 281 150 - 400 K/uL   nRBC 0.0 0.0 - 0.2 %    Comment: Performed at [facility], [address], [place], [place] [address]   CT Angio Chest/Abd/Pel for Dissection W and/or Wo Contrast  Result Date: 11/18/2022 CLINICAL DATA:  Acute aortic syndrome suspected. Chest tightness radiating to left arm with nausea. EXAM: CT ANGIOGRAPHY CHEST, ABDOMEN AND PELVIS TECHNIQUE: Non-contrast CT of the chest was initially obtained. Multidetector CT imaging through the chest, abdomen and pelvis was performed using the standard protocol during bolus administration of intravenous contrast. Multiplanar reconstructed images and MIPs were obtained and reviewed to evaluate the vascular anatomy. RADIATION DOSE REDUCTION: This exam was performed according to the departmental dose-optimization program which includes automated exposure control, adjustment of the mA and/or  kV according to patient size and/or use of iterative reconstruction technique. CONTRAST:  OMNIPAQUE IOHEXOL 350 MG/ML SOLN COMPARISON:  None Available. FINDINGS: CTA CHEST FINDINGS Cardiovascular: The heart is normal in size and there is no pericardial effusion. A few scattered coronary artery calcifications are noted. The aorta and pulmonary trunk are normal in  caliber. No dissection is seen. Mediastinum/Nodes: No enlarged mediastinal, hilar, or axillary lymph nodes. Thyroid gland, trachea, and esophagus demonstrate no significant findings. Lungs/Pleura: Mild atelectasis or scarring is present bilaterally. No effusion or pneumothorax. Musculoskeletal: Degenerative changes are present in the thoracic spine. No acute osseous abnormality is seen. Review of the MIP images confirms the above findings. CTA ABDOMEN AND PELVIS FINDINGS VASCULAR Aorta: Normal caliber aorta without aneurysm, dissection, vasculitis or significant stenosis. Celiac: Patent without evidence of aneurysm, dissection, vasculitis or significant stenosis. SMA: Patent without evidence of aneurysm, dissection, vasculitis or significant stenosis. Renals: Both renal arteries are patent without evidence of aneurysm, dissection, vasculitis, fibromuscular dysplasia or significant stenosis. IMA: Patent. Inflow: Patent without evidence of aneurysm, dissection, vasculitis or significant stenosis. Veins: No obvious venous abnormality within the limitations of this arterial phase study. Review of the MIP images confirms the above findings. NON-VASCULAR Hepatobiliary: A 1.8 cm hypodensity is noted in the right lobe of the liver, possible cyst or hemangioma. Stones are present within the gallbladder. The common bile duct is distended measuring 1.1 cm. There is a 8 mm stone in the distal common bile duct at the ampulla. Pancreas: Unremarkable. No pancreatic ductal dilatation or surrounding inflammatory changes. Spleen: Normal in size without focal abnormality. Adrenals/Urinary Tract: The adrenal glands are within normal limits. The kidneys enhance symmetrically. No renal calculus or hydronephrosis bilaterally. The bladder is unremarkable. Stomach/Bowel: Stomach is within normal limits. Appendix is surgically absent. The cecum and ileocecal junction are noted in the mid left abdomen. No evidence of bowel wall thickening,  distention, or inflammatory changes. No free air or pneumatosis is seen. Lymphatic: No abdominal or pelvic lymphadenopathy. Reproductive: The uterus is not seen.  No adnexal masses. Other: No abdominopelvic ascites. A fat containing umbilical hernia is noted. Musculoskeletal: Total hip arthroplasty changes are present on the left. Sclerosis is present at the sacroiliac joints bilaterally, compatible with sacroiliitis. Degenerative changes are noted in the lumbar spine. No acute osseous abnormality. Review of the MIP images confirms the above findings. IMPRESSION: 1. No evidence of aortic aneurysm or dissection. 2. Distended common bile duct with a 8 mm stone in the distal common bile duct at the ampulla. 3. Cholelithiasis. 4. Coronary artery calcifications. Electronically Signed   By: Thornell Sartorius M.D.   On: 11/18/2022 03:46   DG Chest 2 View  Result Date: 11/18/2022 CLINICAL DATA:  Chest tightness extending to left arm with nausea. EXAM: CHEST - 2 VIEW COMPARISON:  None Available. FINDINGS: The heart size and mediastinal contours are within normal limits. No consolidation, effusion, or pneumothorax. There is dextroscoliosis of the thoracic spine. No acute osseous abnormality is seen. IMPRESSION: No active cardiopulmonary disease. Electronically Signed   By: Thornell Sartorius M.D.   On: 11/18/2022 00:16      Assessment/Plan Cholelithiasis Choledocholithiasis s/p ERCP  Patient seen and examined and relevant labs and imaging reviewed. She has had successful ERCP for CBD stone/ sludge. She has cholelithiasis and is candidate for laparoscopic cholecystectomy this admission due to risk for recurrence. Discussed with patient and she would like to proceed with lap chole. Will plan for OR tomorrow. She can have CLD today and  NPO MN  The surgical procedure has been discussed with the patient.  Potential risks, benefits, alternative treatments, and expected outcomes have been explained.  All of the patient's  questions at this time have been answered.  The likelihood of reaching the patient's treatment goal is good.  The patient understand the proposed surgical procedure and wishes to proceed.   Eric Form, PA-C [facility] Surgery 11/19/2022, 11:00 AM Please see Amion for pager number during day hours 7:00am-4:30pm    Wilmon Arms. Corliss Skains, MD, [facility] Surgery  General Surgery   11/19/2022 4:25 PM

## 2022-11-19 NOTE — Anesthesia Procedure Notes (Signed)
Procedure Name: Intubation Date/Time: 11/19/2022 1:40 PM  Performed by: Elyn Peers, CRNAPre-anesthesia Checklist: Patient identified, Emergency Drugs available, Suction available, Patient being monitored and Timeout performed Patient Re-evaluated:Patient Re-evaluated prior to induction Oxygen Delivery Method: Circle system utilized Preoxygenation: Pre-oxygenation with 100% oxygen Induction Type: IV induction Ventilation: Mask ventilation without difficulty Laryngoscope Size: Miller and 3 Grade View: Grade I Tube type: Oral Tube size: 7.5 mm Number of attempts: 1 Airway Equipment and Method: Stylet Placement Confirmation: ETT inserted through vocal cords under direct vision, positive ETCO2 and breath sounds checked- equal and bilateral Secured at: 22 cm Tube secured with: Tape Dental Injury: Teeth and Oropharynx as per pre-operative assessment

## 2022-11-19 NOTE — Anesthesia Postprocedure Evaluation (Signed)
Anesthesia Post Note  Patient: Sherree Shankman  Procedure(s) Performed: ENDOSCOPIC RETROGRADE CHOLANGIOPANCREATOGRAPHY (ERCP) REMOVAL OF sludge SPHINCTEROTOMY     Patient location during evaluation: PACU Anesthesia Type: General Level of consciousness: awake and alert, oriented and patient cooperative Pain management: pain level controlled Vital Signs Assessment: post-procedure vital signs reviewed and stable Respiratory status: spontaneous breathing, nonlabored ventilation and respiratory function stable Cardiovascular status: blood pressure returned to baseline and stable Postop Assessment: no apparent nausea or vomiting Anesthetic complications: no   No notable events documented.  Last Vitals:  Vitals:   11/19/22 1441 11/19/22 1455  BP: (!) 149/84 (!) 141/91  Pulse: 81 74  Resp: 12 17  Temp:  (!) 36.4 C  SpO2: 98% 100%    Last Pain:  Vitals:   11/19/22 1455  TempSrc: Axillary  PainSc:                  Lannie Fields

## 2022-11-19 NOTE — Transfer of Care (Signed)
Immediate Anesthesia Transfer of Care Note  Patient: Lindsey Sanchez  Procedure(s) Performed: ENDOSCOPIC RETROGRADE CHOLANGIOPANCREATOGRAPHY (ERCP)  Patient Location: PACU and Endoscopy Unit  Anesthesia Type:General  Level of Consciousness: awake, alert , and patient cooperative  Airway & Oxygen Therapy: Patient Spontanous Breathing and Patient connected to face mask oxygen  Post-op Assessment: Report given to RN, Post -op Vital signs reviewed and stable, and BP 137/93  Post vital signs: Reviewed and stable  Last Vitals:  Vitals Value Taken Time  BP    Temp    Pulse 96 11/19/22 1421  Resp 15 11/19/22 1421  SpO2 100 % 11/19/22 1421  Vitals shown include unfiled device data.  Last Pain:  Vitals:   11/19/22 1230  TempSrc: Temporal  PainSc: 4          Complications: No notable events documented.

## 2022-11-19 NOTE — Plan of Care (Signed)

## 2022-11-19 NOTE — Progress Notes (Signed)
   11/19/22 1536  TOC Brief Assessment  Insurance and Status Reviewed  Patient has primary care physician Yes  Home environment has been reviewed home with children  Prior level of function: independent  Prior/Current Home Services No current home services  Social Determinants of Health Reivew SDOH reviewed no interventions necessary  Readmission risk has been reviewed Yes  Transition of care needs no transition of care needs at this time

## 2022-11-19 NOTE — Op Note (Signed)
[facility] Patient Name: Lindsey Sanchez Procedure Date: 11/19/2022 MRN: [id number] Attending MD: Vida Rigger , MD, [id number] Date of Birth: 08-14-1970 CSN: [id number] Age: 52 Admit Type: Inpatient Procedure:                ERCP Indications:              Bile duct stone(s) on CT scan, Elevated liver                            enzymes in patient with gallstones as well Providers:                Vida Rigger, MD, Suzy Bouchard, RN, Janae Sauce. Steele Berg,                            RN, [employer], Technician, Lynnell Jude. Freida Busman                            CRNA, CRNA Referring MD:              Medicines:                General Anesthesia Complications:            No immediate complications. Estimated Blood Loss:     Estimated blood loss was minimal. Procedure:                Pre-Anesthesia Assessment:                           - Prior to the procedure, a History and Physical                            was performed, and patient medications and                            allergies were reviewed. The patient's tolerance of                            previous anesthesia was also reviewed. The risks                            and benefits of the procedure and the sedation                            options and risks were discussed with the patient.                            All questions were answered, and informed consent                            was obtained. Prior Anticoagulants: The patient has                            taken no anticoagulant or antiplatelet agents. ASA  Grade Assessment: II - A patient with mild systemic                            disease. After reviewing the risks and benefits,                            the patient was deemed in satisfactory condition to                            undergo the procedure.                           After obtaining informed consent, the scope was                            passed under direct vision.  Throughout the                            procedure, the patient's blood pressure, pulse, and                            oxygen saturations were monitored continuously. The                            [id number]-Q190V ([id number] Olympus duodenoscope was                            introduced through the mouth, and used to inject                            contrast into and used to cannulate the bile duct.                            The ERCP was accomplished without difficulty. The                            patient tolerated the procedure well. Scope In: Scope Out: Findings:      The major papilla was normal. Deep selective cannulation was readily       obtained and we thought we saw some small stones on initial       cholangiogram and we proceeded with the customary biliary sphincterotomy       which was made with a Hydratome sphincterotome using ERBE       electrocautery. There was self limited oozing from the sphincterotomy       which did not require treatment. We proceeded until we had adequate       biliary drainage and could get the fully bowed sphincterotome easily in       and out of the duct and to discover objects, the biliary tree was swept       with an adjustable 9- 12 mm balloon starting at the bifurcation. Minimal       sludge was swept from the duct. Both size balloons passed readily       through the patent sphincterotomy and on occlusion cholangiogram there  was no residual abnormality seen and there was adequate biliary drainage       and the wire and the balloon catheter were removed and the scope was       removed and the patient tolerated the procedure well and there was no       pancreatic duct injection or wire advancement throughout the procedure Impression:               - The major papilla appeared normal.                           - A biliary sphincterotomy was performed.                           - The biliary tree was swept and sludge was found. Moderate  Sedation:      Not Applicable - Patient had care per Anesthesia. Recommendation:           - Clear liquid diet today. N.p.o. after midnight                            for possible cholecystectomy tomorrow                           - Continue present medications.                           - Return to GI clinic PRN.                           - Telephone GI clinic if symptomatic PRN.                           - Refer to a surgeon today for consideration of                            cholecystectomy tomorrow. Procedure Code(s):        --- Professional ---                           [phone], Endoscopic retrograde                            cholangiopancreatography (ERCP); with removal of                            calculi/debris from biliary/pancreatic duct(s)                           43262, Endoscopic retrograde                            cholangiopancreatography (ERCP); with                            sphincterotomy/papillotomy Diagnosis Code(s):        --- Professional ---  K80.50, Calculus of bile duct without cholangitis                            or cholecystitis without obstruction                           R74.8, Abnormal levels of other serum enzymes CPT copyright 2022 American [employer] Association. All rights reserved. The codes documented in this report are preliminary and upon coder review may  be revised to meet current compliance requirements. Vida Rigger, MD 11/19/2022 2:14:25 PM This report has been signed electronically. Number of Addenda: 0

## 2022-11-20 ENCOUNTER — Inpatient Hospital Stay (HOSPITAL_COMMUNITY): Payer: Medicaid Other

## 2022-11-20 ENCOUNTER — Encounter (HOSPITAL_COMMUNITY)
Admission: EM | Disposition: A | Discharge status: Home / Self Care | Payer: Self-pay | Source: Home / Self Care | Attending: Internal Medicine

## 2022-11-20 ENCOUNTER — Encounter (HOSPITAL_COMMUNITY): Payer: Self-pay | Admitting: Internal Medicine

## 2022-11-20 DIAGNOSIS — K8070 Calculus of gallbladder and bile duct without cholecystitis without obstruction: Secondary | ICD-10-CM | POA: Diagnosis not present

## 2022-11-20 DIAGNOSIS — K8050 Calculus of bile duct without cholangitis or cholecystitis without obstruction: Secondary | ICD-10-CM | POA: Diagnosis not present

## 2022-11-20 DIAGNOSIS — R7401 Elevation of levels of liver transaminase levels: Secondary | ICD-10-CM | POA: Diagnosis not present

## 2022-11-20 DIAGNOSIS — E669 Obesity, unspecified: Secondary | ICD-10-CM | POA: Diagnosis not present

## 2022-11-20 HISTORY — PX: CHOLECYSTECTOMY: SHX55

## 2022-11-20 LAB — COMPREHENSIVE METABOLIC PANEL
ALT: 288 U/L — ABNORMAL HIGH (ref 0–44)
AST: 71 U/L — ABNORMAL HIGH (ref 15–41)
Albumin: 3.4 g/dL — ABNORMAL LOW (ref 3.5–5.0)
Alkaline Phosphatase: 92 U/L (ref 38–126)
Anion gap: 6 (ref 5–15)
BUN: 6 mg/dL (ref 6–20)
CO2: 24 mmol/L (ref 22–32)
Calcium: 8.9 mg/dL (ref 8.9–10.3)
Chloride: 103 mmol/L (ref 98–111)
Creatinine, Ser: 0.49 mg/dL (ref 0.44–1.00)
GFR, Estimated: >60 mL/min (ref >60)
Glucose, Bld: 99 mg/dL (ref 70–99)
Potassium: 3.3 mmol/L — ABNORMAL LOW (ref 3.5–5.1)
Sodium: 133 mmol/L — ABNORMAL LOW (ref 135–145)
Total Bilirubin: 0.9 mg/dL (ref <1.2)
Total Protein: 6.5 g/dL (ref 6.5–8.1)

## 2022-11-20 LAB — CBC WITH DIFFERENTIAL/PLATELET
Abs Immature Granulocytes: 0.04 10*3/uL (ref 0.00–0.07)
Basophils Absolute: 0 10*3/uL (ref 0.0–0.1)
Basophils Relative: 0 %
Eosinophils Absolute: 0.1 10*3/uL (ref 0.0–0.5)
Eosinophils Relative: 1 %
HCT: 38.3 % (ref 36.0–46.0)
Hemoglobin: 12.8 g/dL (ref 12.0–15.0)
Immature Granulocytes: 1 %
Lymphocytes Relative: 29 %
Lymphs Abs: 2.1 10*3/uL (ref 0.7–4.0)
MCH: 32.2 pg (ref 26.0–34.0)
MCHC: 33.4 g/dL (ref 30.0–36.0)
MCV: 96.2 fL (ref 80.0–100.0)
Monocytes Absolute: 0.7 10*3/uL (ref 0.1–1.0)
Monocytes Relative: 9 %
Neutro Abs: 4.5 10*3/uL (ref 1.7–7.7)
Neutrophils Relative %: 60 %
Platelets: 295 10*3/uL (ref 150–400)
RBC: 3.98 MIL/uL (ref 3.87–5.11)
RDW: 13.2 % (ref 11.5–15.5)
WBC: 7.4 10*3/uL (ref 4.0–10.5)
nRBC: 0 % (ref 0.0–0.2)

## 2022-11-20 SURGERY — LAPAROSCOPIC CHOLECYSTECTOMY
Anesthesia: General

## 2022-11-20 MED ORDER — KETOROLAC TROMETHAMINE 30 MG/ML IJ SOLN
INTRAMUSCULAR | Status: AC
  Filled 2022-11-20: qty 1

## 2022-11-20 MED ORDER — LACTATED RINGERS IV SOLN: INTRAVENOUS | Status: DC

## 2022-11-20 MED ORDER — FENTANYL CITRATE (PF) 100 MCG/2ML IJ SOLN
INTRAMUSCULAR | Status: AC
  Filled 2022-11-20: qty 2

## 2022-11-20 MED ORDER — OXYCODONE HCL 5 MG PO TABS
5.0000 mg | ORAL_TABLET | Freq: Once | ORAL | Status: AC | PRN
  Administered 2022-11-20: 5 mg via ORAL

## 2022-11-20 MED ORDER — SUGAMMADEX SODIUM 200 MG/2ML IV SOLN
INTRAVENOUS | Status: DC | PRN
  Administered 2022-11-20: 300 mg via INTRAVENOUS

## 2022-11-20 MED ORDER — LIDOCAINE HCL (PF) 2 % IJ SOLN
INTRAMUSCULAR | Status: AC
  Filled 2022-11-20: qty 5

## 2022-11-20 MED ORDER — OXYCODONE HCL 5 MG PO TABS: 5.0000 mg | ORAL_TABLET | Freq: Four times a day (QID) | ORAL | 0 refills | Status: AC | PRN

## 2022-11-20 MED ORDER — ORAL CARE MOUTH RINSE: 15.0000 mL | Freq: Once | OROMUCOSAL | Status: AC

## 2022-11-20 MED ORDER — ALUM & MAG HYDROXIDE-SIMETH 200-200-20 MG/5ML PO SUSP
30.0000 mL | ORAL | Status: DC | PRN
  Administered 2022-11-21: 30 mL via ORAL
  Filled 2022-11-20: qty 30

## 2022-11-20 MED ORDER — MIDAZOLAM HCL 5 MG/5ML IJ SOLN
INTRAMUSCULAR | Status: DC | PRN
  Administered 2022-11-20: 2 mg via INTRAVENOUS

## 2022-11-20 MED ORDER — FENTANYL CITRATE PF 50 MCG/ML IJ SOSY
PREFILLED_SYRINGE | INTRAMUSCULAR | Status: AC
  Filled 2022-11-20: qty 2

## 2022-11-20 MED ORDER — FENTANYL CITRATE PF 50 MCG/ML IJ SOSY
PREFILLED_SYRINGE | INTRAMUSCULAR | Status: AC
  Filled 2022-11-20: qty 1

## 2022-11-20 MED ORDER — DEXMEDETOMIDINE HCL IN NACL 200 MCG/50ML IV SOLN
INTRAVENOUS | Status: DC | PRN
  Administered 2022-11-20: 8 ug via INTRAVENOUS
  Administered 2022-11-20: 4 ug via INTRAVENOUS
  Administered 2022-11-20: 8 ug via INTRAVENOUS

## 2022-11-20 MED ORDER — DEXAMETHASONE SODIUM PHOSPHATE 4 MG/ML IJ SOLN
INTRAMUSCULAR | Status: DC | PRN
  Administered 2022-11-20: 10 mg via INTRAVENOUS

## 2022-11-20 MED ORDER — LACTATED RINGERS IV SOLN
INTRAVENOUS | Status: DC
Start: 2022-11-20 — End: 2022-11-20

## 2022-11-20 MED ORDER — ONDANSETRON HCL 4 MG/2ML IJ SOLN
INTRAMUSCULAR | Status: DC | PRN
  Administered 2022-11-20: 4 mg via INTRAVENOUS

## 2022-11-20 MED ORDER — HYDROMORPHONE HCL 1 MG/ML IJ SOLN
INTRAMUSCULAR | Status: AC
  Filled 2022-11-20: qty 1

## 2022-11-20 MED ORDER — MIDAZOLAM HCL 2 MG/2ML IJ SOLN
INTRAMUSCULAR | Status: AC
  Filled 2022-11-20: qty 2

## 2022-11-20 MED ORDER — FENTANYL CITRATE PF 50 MCG/ML IJ SOSY
25.0000 ug | PREFILLED_SYRINGE | INTRAMUSCULAR | Status: DC | PRN
  Administered 2022-11-20 (×3): 50 ug via INTRAVENOUS

## 2022-11-20 MED ORDER — HYDROMORPHONE HCL 1 MG/ML IJ SOLN
0.5000 mg | Freq: Once | INTRAMUSCULAR | Status: AC
  Administered 2022-11-20: 0.5 mg via INTRAVENOUS

## 2022-11-20 MED ORDER — BUPIVACAINE HCL (PF) 0.25 % IJ SOLN
INTRAMUSCULAR | Status: AC
  Filled 2022-11-20: qty 30

## 2022-11-20 MED ORDER — KETOROLAC TROMETHAMINE 30 MG/ML IJ SOLN
INTRAMUSCULAR | Status: DC | PRN
  Administered 2022-11-20: 30 mg via INTRAVENOUS

## 2022-11-20 MED ORDER — ACETAMINOPHEN 10 MG/ML IV SOLN: 1000.0000 mg | Freq: Once | INTRAVENOUS | Status: DC | PRN

## 2022-11-20 MED ORDER — DEXAMETHASONE SODIUM PHOSPHATE 10 MG/ML IJ SOLN
INTRAMUSCULAR | Status: AC
  Filled 2022-11-20: qty 1

## 2022-11-20 MED ORDER — BUPIVACAINE HCL (PF) 0.25 % IJ SOLN
INTRAMUSCULAR | Status: DC | PRN
  Administered 2022-11-20: 10 mL

## 2022-11-20 MED ORDER — OXYCODONE HCL 5 MG PO TABS
ORAL_TABLET | ORAL | Status: AC
  Filled 2022-11-20: qty 1

## 2022-11-20 MED ORDER — LACTATED RINGERS IV SOLN: INTRAVENOUS | Status: AC

## 2022-11-20 MED ORDER — OXYCODONE HCL 5 MG PO TABS
5.0000 mg | ORAL_TABLET | Freq: Once | ORAL | Status: AC
  Administered 2022-11-20: 5 mg via ORAL

## 2022-11-20 MED ORDER — ROCURONIUM BROMIDE 100 MG/10ML IV SOLN
INTRAVENOUS | Status: DC | PRN
  Administered 2022-11-20: 60 mg via INTRAVENOUS

## 2022-11-20 MED ORDER — ONDANSETRON HCL 4 MG/2ML IJ SOLN
INTRAMUSCULAR | Status: AC
Start: 2022-11-20 — End: ?
  Filled 2022-11-20: qty 2

## 2022-11-20 MED ORDER — CHLORHEXIDINE GLUCONATE 0.12 % MT SOLN
15.0000 mL | Freq: Once | OROMUCOSAL | Status: AC
  Administered 2022-11-20: 15 mL via OROMUCOSAL

## 2022-11-20 MED ORDER — OXYCODONE HCL 5 MG/5ML PO SOLN: 5.0000 mg | Freq: Once | ORAL | Status: AC | PRN

## 2022-11-20 MED ORDER — PROPOFOL 10 MG/ML IV BOLUS
INTRAVENOUS | Status: DC | PRN
  Administered 2022-11-20: 150 mg via INTRAVENOUS
  Administered 2022-11-20 (×3): 50 mg via INTRAVENOUS

## 2022-11-20 MED ORDER — DROPERIDOL 2.5 MG/ML IJ SOLN: 0.6250 mg | Freq: Once | INTRAMUSCULAR | Status: DC | PRN

## 2022-11-20 MED ORDER — PROPOFOL 10 MG/ML IV BOLUS
INTRAVENOUS | Status: AC
  Filled 2022-11-20: qty 20

## 2022-11-20 MED ORDER — FENTANYL CITRATE (PF) 100 MCG/2ML IJ SOLN
INTRAMUSCULAR | Status: DC | PRN
  Administered 2022-11-20: 100 ug via INTRAVENOUS

## 2022-11-20 MED ORDER — LIDOCAINE HCL (CARDIAC) PF 100 MG/5ML IV SOSY
PREFILLED_SYRINGE | INTRAVENOUS | Status: DC | PRN
  Administered 2022-11-20: 100 mg via INTRAVENOUS

## 2022-11-20 SURGICAL SUPPLY — 42 items
APL PRP STRL LF DISP 70% ISPRP (MISCELLANEOUS) ×1
APL SKNCLS STERI-STRIP NONHPOA (GAUZE/BANDAGES/DRESSINGS) ×1
APPLIER CLIP ROT 10 11.4 M/L (STAPLE) ×1
APR CLP MED LRG 11.4X10 (STAPLE) ×1
BAG SPEC RTRVL 10 TROC 200 (ENDOMECHANICALS)
BENZOIN TINCTURE PRP APPL 2/3 (GAUZE/BANDAGES/DRESSINGS) ×1 IMPLANT
CABLE HIGH FREQUENCY MONO STRZ (ELECTRODE) ×1 IMPLANT
CHLORAPREP W/TINT 26 (MISCELLANEOUS) ×1 IMPLANT
CLIP APPLIE ROT 10 11.4 M/L (STAPLE) ×1 IMPLANT
COVER MAYO STAND XLG (MISCELLANEOUS) ×1 IMPLANT
COVER SURGICAL LIGHT HANDLE (MISCELLANEOUS) ×1 IMPLANT
DRAPE C-ARM 42X120 X-RAY (DRAPES) ×1 IMPLANT
DRSG TEGADERM 2-3/8X2-3/4 SM (GAUZE/BANDAGES/DRESSINGS) ×3 IMPLANT
DRSG TEGADERM 4X4.75 (GAUZE/BANDAGES/DRESSINGS) ×1 IMPLANT
ELECT REM PT RETURN 15FT ADLT (MISCELLANEOUS) ×1 IMPLANT
GAUZE SPONGE 2X2 8PLY STRL LF (GAUZE/BANDAGES/DRESSINGS) IMPLANT
GLOVE BIO SURGEON STRL SZ7 (GLOVE) ×1 IMPLANT
GLOVE BIOGEL PI IND STRL 7.5 (GLOVE) ×1 IMPLANT
GOWN STRL REUS W/ TWL LRG LVL3 (GOWN DISPOSABLE) ×1 IMPLANT
GOWN STRL REUS W/TWL LRG LVL3 (GOWN DISPOSABLE) ×1
IRRIG SUCT STRYKERFLOW 2 WTIP (MISCELLANEOUS) ×1
IRRIGATION SUCT STRKRFLW 2 WTP (MISCELLANEOUS) ×1 IMPLANT
KIT BASIN OR (CUSTOM PROCEDURE TRAY) ×1 IMPLANT
KIT IMAGING PINPOINTPAQ (MISCELLANEOUS) IMPLANT
KIT TURNOVER KIT A (KITS) IMPLANT
NS IRRIG 1000ML POUR BTL (IV SOLUTION) ×1 IMPLANT
POUCH RETRIEVAL ECOSAC 10 (ENDOMECHANICALS) IMPLANT
SCISSORS LAP 5X35 DISP (ENDOMECHANICALS) ×1 IMPLANT
SET CHOLANGIOGRAPH MIX (MISCELLANEOUS) ×1 IMPLANT
SET TUBE SMOKE EVAC HIGH FLOW (TUBING) ×1 IMPLANT
SLEEVE Z-THREAD 5X100MM (TROCAR) ×1 IMPLANT
SPIKE FLUID TRANSFER (MISCELLANEOUS) ×1 IMPLANT
STRIP CLOSURE SKIN 1/2X4 (GAUZE/BANDAGES/DRESSINGS) ×1 IMPLANT
SUT MNCRL AB 4-0 PS2 18 (SUTURE) ×1 IMPLANT
SYS BAG RETRIEVAL 10MM (BASKET)
SYSTEM BAG RETRIEVAL 10MM (BASKET) IMPLANT
TOWEL OR 17X26 10 PK STRL BLUE (TOWEL DISPOSABLE) ×1 IMPLANT
TOWEL OR NON WOVEN STRL DISP B (DISPOSABLE) ×1 IMPLANT
TRAY LAPAROSCOPIC (CUSTOM PROCEDURE TRAY) ×1 IMPLANT
TROCAR 11X100 Z THREAD (TROCAR) ×1 IMPLANT
TROCAR BALLN 12MMX100 BLUNT (TROCAR) ×1 IMPLANT
TROCAR Z-THREAD OPTICAL 5X100M (TROCAR) ×1 IMPLANT

## 2022-11-20 NOTE — Progress Notes (Signed)
1 Day Post-Op   Subjective/Chief Complaint: Resting comfortably Sore throat    Objective: Vital signs in last 24 hours: Temp:  [97.4 F (36.3 C)-98.2 F (36.8 C)] 97.9 F (36.6 C) (11/08 0600) Pulse Rate:  [72-92] 73 (11/08 0600) Resp:  [12-19] 18 (11/08 0600) BP: (132-149)/(72-93) 136/81 (11/08 0600) SpO2:  [97 %-100 %] 99 % (11/08 0600) Last BM Date : 11/17/22  Intake/Output from previous day: 11/07 0701 - 11/08 0700 In: 1130 [P.O.:360; I.V.:570; IV Piggyback:200] Out: 3300 [Urine:3300] Intake/Output this shift: No intake/output data recorded.  General appearance: alert, cooperative, and no distress GI: soft, non-tender; bowel sounds normal; no masses,  no organomegaly  Lab Results:  Recent Labs    11/17/22 2317 11/19/22 0450  WBC 7.6 3.7*  HGB 13.9 12.6  HCT 40.9 37.6  PLT 302 281   BMET Recent Labs    11/17/22 2317 11/19/22 0450  NA 134* 133*  K 3.6 3.7  CL 100 103  CO2 26 24  GLUCOSE 141* 98  BUN 11 <5*  CREATININE 0.75 0.44  CALCIUM 9.4 8.6*   PT/INR No results for input(s): "LABPROT", "INR" in the last 72 hours. ABG No results for input(s): "PHART", "HCO3" in the last 72 hours.  Invalid input(s): "PCO2", "PO2"  Studies/Results: DG ERCP  Result Date: 11/20/2022 CLINICAL DATA:  Bile duct stone based on CTA. EXAM: ERCP TECHNIQUE: Multiple spot images obtained with the fluoroscopic device and submitted for interpretation post-procedure. FLUOROSCOPY: Radiation Exposure Index (as provided by the fluoroscopic device): 46.08 mGy Kerma COMPARISON:  CTA chest abdomen pelvis 11/18/2022 FINDINGS: Retrograde cholangiogram was performed. No significant biliary dilatation identified. No large filling defects identified on these images. IMPRESSION: No significant abnormality identified on the submitted cholangiogram images. These images were submitted for radiologic interpretation only. Please see the procedural report. Electronically Signed   By: Richarda Overlie  M.D.   On: 11/20/2022 08:13    Anti-infectives: Anti-infectives (From admission, onward)    Start     Dose/Rate Route Frequency Ordered Stop   11/20/22 0800  ceFAZolin (ANCEF) IVPB 2g/100 mL premix        2 g 200 mL/hr over 30 Minutes Intravenous  Once 11/19/22 1629     11/19/22 1345  ciprofloxacin (CIPRO) IVPB 400 mg        400 mg 200 mL/hr over 60 Minutes Intravenous  Once 11/19/22 1337 11/19/22 1431       Assessment/Plan: Cholelithiasis Choledocholithiasis s/p ERCP 11/19/22 [facility]  Plan laparoscopic cholecystectomy today. The surgical procedure has been discussed with the patient.  Potential risks, benefits, alternative treatments, and expected outcomes have been explained.  All of the patient's questions at this time have been answered.  The likelihood of reaching the patient's treatment goal is good.  The patient understand the proposed surgical procedure and wishes to proceed.  Possible discharge late this afternoon.  LOS: 2 days    Wynona Luna 11/20/2022

## 2022-11-20 NOTE — Transfer of Care (Signed)
Immediate Anesthesia Transfer of Care Note  Patient: Lindsey Sanchez  Procedure(s) Performed: LAPAROSCOPIC CHOLECYSTECTOMY  Patient Location: PACU  Anesthesia Type:General  Level of Consciousness: drowsy and patient cooperative  Airway & Oxygen Therapy: Patient Spontanous Breathing and Patient connected to nasal cannula oxygen  Post-op Assessment: Report given to RN and Patient moving all extremities  Post vital signs: Reviewed and stable  Last Vitals:  Vitals Value Taken Time  BP 158/93 11/20/22 1218  Temp 36.9 C 11/20/22 1218  Pulse 72 11/20/22 1218  Resp 15 11/20/22 1218  SpO2 100 % 11/20/22 1218  Vitals shown include unfiled device data.  Last Pain:  Vitals:   11/20/22 1218  TempSrc: Oral  PainSc:       Patients Stated Pain Goal: 4 (11/20/22 1046)  Complications: No notable events documented.

## 2022-11-20 NOTE — Progress Notes (Signed)
CCC Pre-op Review  Pre-op checklist: To be completed by bedside RN  NPO: ordered  Labs: NA 133  Consent: Ordered  H&P: Hospitalist   Vitals: WNL  O2 requirements: RA  MAR/PTA review: No BB, No GLPs, No anticoags  IV: Need to verify size  Floor nurse name:  Geradine Girt, RN   Additional info:

## 2022-11-20 NOTE — Plan of Care (Signed)

## 2022-11-20 NOTE — Progress Notes (Signed)
Lindsey Sanchez 10:58 AM  Subjective: Patient seen in preop in good spirits no obvious post ERCP problem and I wished her well with the surgery and she has no new complaints  Objective: Vital signs stable afebrile no acute distress patient not examined today LFTs decreased ABC okay  Assessment: Gallstones and probably passed CBD stone status post ERCP and sphincterotomy  Plan: Please call me if I could be of any further assistance with this hospital stay otherwise we will recommend repeat liver tests as an outpatient and follow back to normal  [facility]  office [phone] After 5PM or if no answer call [phone]

## 2022-11-20 NOTE — Anesthesia Preprocedure Evaluation (Addendum)
Anesthesia Evaluation  Patient identified by MRN, date of birth, ID band Patient awake    Reviewed: Allergy & Precautions, H&P , NPO status , Patient's Chart, lab work & pertinent test results  Airway Mallampati: III  TM Distance: >3 FB Neck ROM: Full    Dental no notable dental hx. (+) Teeth Intact, Dental Advisory Given, Chipped,    Pulmonary neg pulmonary ROS   Pulmonary exam normal breath sounds clear to auscultation       Cardiovascular negative cardio ROS Normal cardiovascular exam Rhythm:Regular Rate:Normal     Neuro/Psych negative neurological ROS  negative psych ROS   GI/Hepatic ,GERD  Medicated,,CHOLEDOCHOLITHIASIS Choledocholithiasis     Endo/Other  negative endocrine ROS  Obesity   Renal/GU negative Renal ROS  negative genitourinary   Musculoskeletal negative musculoskeletal ROS (+)    Abdominal   Peds negative pediatric ROS (+)  Hematology   Anesthesia Other Findings   Reproductive/Obstetrics negative OB ROS                             Anesthesia Physical Anesthesia Plan  ASA: 2  Anesthesia Plan: General   Post-op Pain Management: Ofirmev IV (intra-op)* and Toradol IV (intra-op)*   Induction: Intravenous  PONV Risk Score and Plan: 4 or greater and Dexamethasone, Ondansetron and Midazolam  Airway Management Planned: Oral ETT  Additional Equipment:   Intra-op Plan:   Post-operative Plan: Extubation in OR  Informed Consent: I have reviewed the patients History and Physical, chart, labs and discussed the procedure including the risks, benefits and alternatives for the proposed anesthesia with the patient or authorized representative who has indicated his/her understanding and acceptance.     Dental advisory given  Plan Discussed with: CRNA  Anesthesia Plan Comments:        Anesthesia Quick Evaluation

## 2022-11-20 NOTE — Progress Notes (Signed)
[facility]                                                                              Lindsey Sanchez, is a 52 y.o. female, DOB - February 02, 1970, ZOX:[id number] Admit date - 11/17/2022    Outpatient Primary MD for the patient is Osei-Bonsu, Greggory Stallion, MD  LOS - 2  days  Chief Complaint  Patient presents with   Chest Pain       Brief summary    Patient is a 52 year old female with prior history of hip arthroplasty, appendectomy, hysterectomy presented with upper abdominal pain, nausea that started a day before the admission.  No fevers or vomiting.  No chest pain or shortness of breath in ED. CMET showed elevated LFTs, lipase normal 37 CT chest abdomen pelvis showed distended common bile duct with 8 mm stone in the distal CBD at the ampulla, cholelithiasis.  GI was consulted.  Assessment & Plan    Principal Problem: Cholelithiasis and choledocholithiasis -CT imaging showed distended CBD with 8 mm stone in the distal CBD at the ampulla, cholelithiasis --GI consulted, underwent ERCP on 11/7 s/p biliary sphincterotomy -General Surgery also consulted, plan for lap cholecystectomy today - cont NPO, IVF, pain control and anti emetics   Active Problems:   Transaminitis -Likely due to #1, improving     Obesity (BMI 30-39.9) Estimated body mass index is 31.6 kg/m as calculated from the following:   Height as of this encounter: 6' (1.829 m).   Weight as of this encounter: 105.7 kg.  Code Status: Full CODE STATUS DVT Prophylaxis:  SCDs Start: 11/18/22 0818   Level of Care: Level of care: Med-Surg Family Communication: Updated patient Disposition Plan:      Remains inpatient appropriate: None   Procedures:  CT chest abdomen pelvis  Consultants:   GI General Surgery  Antimicrobials:   Anti-infectives (From admission, onward)    Start     Dose/Rate Route Frequency Ordered Stop   11/20/22 0800  [MAR Hold]  ceFAZolin (ANCEF) IVPB 2g/100 mL premix        (MAR  Hold since Fri 11/20/2022 at 1034.Hold Reason: Transfer to a Procedural area)   2 g 200 mL/hr over 30 Minutes Intravenous  Once 11/19/22 1629 11/20/22 1149   11/19/22 1345  ciprofloxacin (CIPRO) IVPB 400 mg        400 mg 200 mL/hr over 60 Minutes Intravenous  Once 11/19/22 1337 11/19/22 1431          Medications  [MAR Hold] montelukast  10 mg Oral QHS      Subjective:   Lindsey Sanchez was seen and examined today. Feeling better today, awaiting lap chole today. No nausea, vomiting and abdominal pain at this time.    Objective:   Vitals:   11/19/22 2108 11/20/22 0600 11/20/22 1037 11/20/22 1046  BP: (!) 149/90 136/81 (!) 142/81   Pulse: 77 73 70   Resp: 18 18 18    Temp: 98 F (36.7 C) 97.9 F (36.6 C) 99.3 F (37.4 C)   TempSrc:   Oral   SpO2: 98% 99% 99%   Weight:    105.7 kg  Height:    6' (1.829 m)    Intake/Output Summary (Last 24 hours) at 11/20/2022 1159 Last data filed at 11/20/2022 1150 Gross per 24 hour  Intake 1830 ml  Output 2020 ml  Net -190 ml     Wt Readings from Last 3 Encounters:  11/20/22 105.7 kg  06/25/20 105.7 kg  06/17/20 105.7 kg   Physical Exam General: Alert and oriented x 3, NAD Cardiovascular: S1 S2 clear, RRR.  Respiratory: CTAB, no wheezing Gastrointestinal: Soft, nontender, nondistended, NBS Ext: no pedal edema bilaterally Neuro: no new deficits Psych: Normal affect    Data Reviewed:  I have personally reviewed following labs    CBC Lab Results  Component Value Date   WBC 7.4 11/20/2022   RBC 3.98 11/20/2022   HGB 12.8 11/20/2022   HCT 38.3 11/20/2022   MCV 96.2 11/20/2022   MCH 32.2 11/20/2022   PLT 295 11/20/2022   MCHC 33.4 11/20/2022   RDW 13.2 11/20/2022   LYMPHSABS 2.1 11/20/2022   MONOABS 0.7 11/20/2022   EOSABS 0.1 11/20/2022   BASOSABS 0.0 11/20/2022     Last metabolic panel Lab Results  Component Value Date   NA 133 (L) 11/20/2022   K 3.3 (L) 11/20/2022   CL 103 11/20/2022   CO2 24 11/20/2022    BUN 6 11/20/2022   CREATININE 0.49 11/20/2022   GLUCOSE 99 11/20/2022   GFRNONAA >60 11/20/2022   CALCIUM 8.9 11/20/2022   PROT 6.5 11/20/2022   ALBUMIN 3.4 (L) 11/20/2022   BILITOT 0.9 11/20/2022   ALKPHOS 92 11/20/2022   AST 71 (H) 11/20/2022   ALT 288 (H) 11/20/2022   ANIONGAP 6 11/20/2022    CBG (last 3)  No results for input(s): "GLUCAP" in the last 72 hours.    Coagulation Profile: No results for input(s): "INR", "PROTIME" in the last 168 hours.   Radiology Studies: I have personally reviewed the imaging studies  DG ERCP  Result Date: 11/20/2022 CLINICAL DATA:  Bile duct stone based on CTA. EXAM: ERCP TECHNIQUE: Multiple spot images obtained with the fluoroscopic device and submitted for interpretation post-procedure. FLUOROSCOPY: Radiation Exposure Index (as provided by the fluoroscopic device): 46.08 mGy Kerma COMPARISON:  CTA chest abdomen pelvis 11/18/2022 FINDINGS: Retrograde cholangiogram was performed. No significant biliary dilatation identified. No large filling defects identified on these images. IMPRESSION: No significant abnormality identified on the submitted cholangiogram images. These images were submitted for radiologic interpretation only. Please see the procedural report. Electronically Signed   By: Richarda Overlie M.D.   On: 11/20/2022 08:13       Kiani Wurtzel M.D. [facility] 11/20/2022, 11:59 AM  Available via Epic secure chat 7am-7pm After 7 pm, please refer to night coverage provider listed on amion.

## 2022-11-20 NOTE — Anesthesia Postprocedure Evaluation (Signed)
Anesthesia Post Note  Patient: Lindsey Sanchez  Procedure(s) Performed: LAPAROSCOPIC CHOLECYSTECTOMY     Patient location during evaluation: PACU Anesthesia Type: General Level of consciousness: awake and alert Pain management: pain level controlled Vital Signs Assessment: post-procedure vital signs reviewed and stable Respiratory status: spontaneous breathing, nonlabored ventilation, respiratory function stable and patient connected to nasal cannula oxygen Cardiovascular status: blood pressure returned to baseline and stable Postop Assessment: no apparent nausea or vomiting Anesthetic complications: no   No notable events documented.  Last Vitals:  Vitals:   11/20/22 1544 11/20/22 1704  BP: 139/88 (!) 138/92  Pulse: 67 64  Resp: 16 16  Temp: 36.4 C   SpO2: 100% 100%    Last Pain:  Vitals:   11/20/22 2023  TempSrc:   PainSc: 3                  [place]

## 2022-11-20 NOTE — Op Note (Signed)
Laparoscopic Cholecystectomy Procedure Note  Indications: This patient presents with symptomatic gallbladder disease and was found to have elevated bilirubin and choledocholithiasis on CT scan.  She underwent ERCP on 11/19/2022.  Her bilirubin has returned to normal.  She presents now for cholecystectomy.  Pre-operative Diagnosis: Cholelithiasis/ choledocholithiasis  Post-operative Diagnosis: Same  Surgeon: Wynona Luna   Assistants: None  Anesthesia: General endotracheal anesthesia  ASA Class: 2  Procedure Details  The patient was seen again in the Holding Room. The risks, benefits, complications, treatment options, and expected outcomes were discussed with the patient. The possibilities of reaction to medication, pulmonary aspiration, perforation of viscus, bleeding, recurrent infection, finding a normal gallbladder, the need for additional procedures, failure to diagnose a condition, the possible need to convert to an open procedure, and creating a complication requiring transfusion or operation were discussed with the patient. The likelihood of improving the patient's symptoms with return to their baseline status is good.  The patient and/or family concurred with the proposed plan, giving informed consent. The site of surgery properly noted. The patient was taken to Operating Room, identified as Lindsey Sanchez and the procedure verified as Laparoscopic Cholecystectomy with Intraoperative Cholangiogram. A Time Out was held and the above information confirmed.  Prior to the induction of general anesthesia, antibiotic prophylaxis was administered. General endotracheal anesthesia was then administered and tolerated well. After the induction, the abdomen was prepped with Chloraprep and draped in sterile fashion. The patient was positioned in the supine position.  Local anesthetic agent was injected into the skin below the umbilicus and an incision made.  She has a previous infraumbilical  laparoscopic scar.  We excised the old scar, as it appears thickened.  We dissected down to the abdominal fascia with blunt dissection.  The fascia was incised vertically and we entered the peritoneal cavity bluntly.  A pursestring suture of 0-Vicryl was placed around the fascial opening.  The Hasson cannula was inserted and secured with the stay suture.  Pneumoperitoneum was then created with CO2 and tolerated well without any adverse changes in the patient's vital signs. An 11-mm port was placed in the subxiphoid position.  Two 5-mm ports were placed in the right upper quadrant. All skin incisions were infiltrated with a local anesthetic agent before making the incision and placing the trocars.   We positioned the patient in reverse Trendelenburg, tilted slightly to the patient's left.  The gallbladder was identified, the fundus grasped and retracted cephalad. Adhesions were lysed bluntly and with the electrocautery where indicated, taking care not to injure any adjacent organs or viscus. The infundibulum was grasped and retracted laterally, exposing the peritoneum overlying the triangle of Calot. This was then divided and exposed in a blunt fashion. The cystic duct was clearly identified and bluntly dissected circumferentially. A critical view of the cystic duct and cystic artery was obtained.  The cystic duct was then ligated with clips and divided. The cystic artery was, dissected free, ligated with clips and divided as well.   The gallbladder was dissected from the liver bed in retrograde fashion with the electrocautery. The gallbladder was removed and placed in an Eco sac. The liver bed was irrigated and inspected. Hemostasis was achieved with the electrocautery. Copious irrigation was utilized and was repeatedly aspirated until clear.  The gallbladder and Eco sac were then removed through the umbilical port site.  The pursestring suture was used to close the umbilical fascia.    We again inspected the  right upper quadrant for hemostasis.  Pneumoperitoneum was released as we removed the trocars.  4-0 Monocryl was used to close the skin.   Benzoin, steri-strips, and clean dressings were applied. The patient was then extubated and brought to the recovery room in stable condition. Instrument, sponge, and needle counts were correct at closure and at the conclusion of the case.   Findings: Cholecystitis with Cholelithiasis  Estimated Blood Loss: Minimal         Drains: none         Specimens: Gallbladder           Complications: None; patient tolerated the procedure well.         Disposition: PACU - hemodynamically stable.         Condition: stable  Lindsey Sanchez. Lindsey Skains, MD, [facility] Surgery  General Surgery   11/20/2022 12:13 PM

## 2022-11-20 NOTE — Discharge Instructions (Signed)

## 2022-11-20 NOTE — Anesthesia Procedure Notes (Signed)
Procedure Name: Intubation Date/Time: 11/20/2022 11:22 AM  Performed by: Lily Lovings, CRNAPre-anesthesia Checklist: Patient identified, Patient being monitored, Timeout performed, Emergency Drugs available and Suction available Patient Re-evaluated:Patient Re-evaluated prior to induction Oxygen Delivery Method: Circle system utilized Preoxygenation: Pre-oxygenation with 100% oxygen Induction Type: IV induction Ventilation: Mask ventilation without difficulty Laryngoscope Size: Mac and 3 Grade View: Grade I Tube type: Oral Tube size: 7.0 mm Number of attempts: 1 Airway Equipment and Method: Stylet Placement Confirmation: ETT inserted through vocal cords under direct vision, positive ETCO2 and breath sounds checked- equal and bilateral Secured at: 22 cm Tube secured with: Tape Dental Injury: Teeth and Oropharynx as per pre-operative assessment

## 2022-11-21 ENCOUNTER — Encounter (HOSPITAL_COMMUNITY): Payer: Self-pay | Admitting: Surgery

## 2022-11-21 DIAGNOSIS — R7401 Elevation of levels of liver transaminase levels: Secondary | ICD-10-CM | POA: Diagnosis not present

## 2022-11-21 DIAGNOSIS — K8050 Calculus of bile duct without cholangitis or cholecystitis without obstruction: Secondary | ICD-10-CM | POA: Diagnosis not present

## 2022-11-21 DIAGNOSIS — E669 Obesity, unspecified: Secondary | ICD-10-CM | POA: Diagnosis not present

## 2022-11-21 LAB — COMPREHENSIVE METABOLIC PANEL
ALT: 205 U/L — ABNORMAL HIGH (ref 0–44)
AST: 53 U/L — ABNORMAL HIGH (ref 15–41)
Albumin: 3.2 g/dL — ABNORMAL LOW (ref 3.5–5.0)
Alkaline Phosphatase: 80 U/L (ref 38–126)
Anion gap: 7 (ref 5–15)
BUN: <5 mg/dL — ABNORMAL LOW (ref 6–20)
CO2: 24 mmol/L (ref 22–32)
Calcium: 8.9 mg/dL (ref 8.9–10.3)
Chloride: 105 mmol/L (ref 98–111)
Creatinine, Ser: 0.58 mg/dL (ref 0.44–1.00)
GFR, Estimated: >60 mL/min (ref >60)
Glucose, Bld: 91 mg/dL (ref 70–99)
Potassium: 4.5 mmol/L (ref 3.5–5.1)
Sodium: 136 mmol/L (ref 135–145)
Total Bilirubin: 1.2 mg/dL — ABNORMAL HIGH (ref <1.2)
Total Protein: 6.5 g/dL (ref 6.5–8.1)

## 2022-11-21 LAB — CBC
HCT: 37.7 % (ref 36.0–46.0)
Hemoglobin: 12.8 g/dL (ref 12.0–15.0)
MCH: 32.3 pg (ref 26.0–34.0)
MCHC: 34 g/dL (ref 30.0–36.0)
MCV: 95.2 fL (ref 80.0–100.0)
Platelets: 300 10*3/uL (ref 150–400)
RBC: 3.96 MIL/uL (ref 3.87–5.11)
RDW: 13.1 % (ref 11.5–15.5)
WBC: 9.4 10*3/uL (ref 4.0–10.5)
nRBC: 0 % (ref 0.0–0.2)

## 2022-11-21 MED ORDER — POLYETHYLENE GLYCOL 3350 17 G PO PACK: 17.0000 g | PACK | Freq: Every day | ORAL | 0 refills | Status: AC | PRN

## 2022-11-21 MED ORDER — ONDANSETRON HCL 4 MG PO TABS: 4.0000 mg | ORAL_TABLET | Freq: Three times a day (TID) | ORAL | 0 refills | Status: AC | PRN

## 2022-11-21 MED ORDER — OMEPRAZOLE MAGNESIUM 20 MG PO TBEC: 20.0000 mg | DELAYED_RELEASE_TABLET | Freq: Every day | ORAL | 0 refills | Status: AC

## 2022-11-21 NOTE — Plan of Care (Signed)
  Problem: Clinical Measurements: Goal: Will remain free from infection Outcome: Progressing Goal: Diagnostic test results will improve Outcome: Progressing   

## 2022-11-21 NOTE — Discharge Summary (Signed)
Physician Discharge Summary   Patient: Lindsey Sanchez MRN: [id number] DOB: 12/07/1970  Admit date:     11/17/2022  Discharge date: 11/21/22  Discharge Physician: Thad Ranger, MD    PCP: Jackie Plum, MD   Recommendations at discharge:   Outpatient follow-up with general surgery and GI Follow LFTs outpatient  Discharge Diagnoses:  Cholelithiasis and choledocholithiasis Status post laparoscopic cholecystectomy   Transaminitis   Obesity (BMI 30-39.9)   Hospital Course: Patient is a 52 year old female with prior history of hip arthroplasty, appendectomy, hysterectomy presented with upper abdominal pain, nausea that started a day before the admission.  No fevers or vomiting.  No chest pain or shortness of breath in ED. CMET showed elevated LFTs, lipase normal 37 CT chest abdomen pelvis showed distended common bile duct with 8 mm stone in the distal CBD at the ampulla, cholelithiasis.  GI was consulted.  Assessment and Plan:  Cholelithiasis and choledocholithiasis -CT imaging showed distended CBD with 8 mm stone in the distal CBD at the ampulla, cholelithiasis --GI was consulted, underwent ERCP on 11/7 s/p biliary sphincterotomy -General Surgery also consulted, underwent laparoscopic cholecystectomy on 11/20/2022 -Postop day #1, doing well, tolerating diet, ambulating in the hallway, cleared by surgery to discharge home.      Transaminitis -Likely due to #1, improving  -Need to repeat LFT's at the follow-up appointment     Obesity (BMI 30-39.9) Estimated body mass index is 31.6 kg/m as calculated from the following:   Height as of this encounter: 6' (1.829 m).   Weight as of this encounter: 105.7 kg.          Pain control - [employer] Controlled Substance Reporting System database was reviewed. and patient was instructed, not to drive, operate heavy machinery, perform activities at heights, swimming or participation in water activities or provide baby-sitting  services while on Pain, Sleep and Anxiety Medications; until their outpatient Physician has advised to do so again. Also recommended to not to take more than prescribed Pain, Sleep and Anxiety Medications.  Consultants: GI, general surgery Procedures performed: ERCP, laparoscopic cholecystectomy Disposition: Home Diet recommendation:  Discharge Diet Orders (From admission, onward)     Start     Ordered   11/21/22 0000  Diet - low sodium heart healthy        11/21/22 1038            DISCHARGE MEDICATION: Allergies as of 11/21/2022   No Known Allergies      Medication List     TAKE these medications    acetaminophen 500 MG tablet Commonly known as: TYLENOL Take 2 tablets (1,000 mg total) by mouth every 6 (six) hours as needed for mild pain or moderate pain.   fluticasone 50 MCG/ACT nasal spray Commonly known as: FLONASE Place 1 spray into both nostrils daily as needed for allergies or rhinitis.   levocetirizine 5 MG tablet Commonly known as: XYZAL Take 5 mg by mouth every evening.   meloxicam 15 MG tablet Commonly known as: MOBIC Take 1 tablet (15 mg total) by mouth daily as needed for pain (and inflammation).   montelukast 10 MG tablet Commonly known as: SINGULAIR Take 10 mg by mouth at bedtime.   multivitamin with minerals Tabs tablet Take 1 tablet by mouth daily.   omeprazole 20 MG tablet Commonly known as: PriLOSEC OTC Take 1 tablet (20 mg total) by mouth daily. For gastric protection   ondansetron 4 MG tablet Commonly known as: Zofran Take 1 tablet (4 mg total) by  mouth every 8 (eight) hours as needed for nausea or vomiting. What changed: when to take this   oxyCODONE 5 MG immediate release tablet Commonly known as: Oxy IR/ROXICODONE Take 1 tablet (5 mg total) by mouth every 6 (six) hours as needed for up to 5 days for moderate pain (pain score 4-6). What changed:  reasons to take this additional instructions   polyethylene glycol 17 g  packet Commonly known as: MIRALAX / GLYCOLAX Take 17 g by mouth daily as needed for mild constipation. Available OTC.        Follow-up Information     Manus Rudd, MD. Call.   Specialty: General Surgery Why: We are making a follow up appointment for you., Please call to confirm appointment time., Arrive 30 minutes early to complete check in, and bring photo ID and insurance card. Contact information: [address] [place] [address] [phone]         Jackie Plum, MD. Schedule an appointment as soon as possible for a visit in 2 week(s).   Specialty: Internal Medicine Why: for hospital follow-up Contact information: [address] [place] [address] [phone]         Vida Rigger, MD. Schedule an appointment as soon as possible for a visit in 3 week(s).   Specialty: Gastroenterology Why: for hospital follow-up Contact information: [address] [place] [address] [phone]                Discharge Exam: Ceasar Mons Weights   11/17/22 2313 11/20/22 1046  Weight: 105.7 kg 105.7 kg   S: Doing well, tolerating diet, ambulating in the hallway, mild drainage from the abdominal incision  BP 127/77 (BP Location: Right Arm)   Pulse 65   Temp 98.3 F (36.8 C) (Oral)   Resp 14   Ht 6' (1.829 m)   Wt 105.7 kg   LMP 04/14/2017 (Approximate)   SpO2 99%   BMI 31.60 kg/m   Physical Exam General: Alert and oriented x 3, NAD Cardiovascular: S1 S2 clear, RRR.  Respiratory: CTAB, no wheezing, rales Gastrointestinal: Soft, NT, dressing with mild drainage Ext: no pedal edema bilaterally Neuro: no new deficits Psych: Normal affect    Condition at discharge: fair  The results of significant diagnostics from this hospitalization (including imaging, microbiology, ancillary and laboratory) are listed below for reference.   Imaging Studies: DG ERCP  Result Date: 11/20/2022 CLINICAL DATA:  Bile duct  stone based on CTA. EXAM: ERCP TECHNIQUE: Multiple spot images obtained with the fluoroscopic device and submitted for interpretation post-procedure. FLUOROSCOPY: Radiation Exposure Index (as provided by the fluoroscopic device): 46.08 mGy Kerma COMPARISON:  CTA chest abdomen pelvis 11/18/2022 FINDINGS: Retrograde cholangiogram was performed. No significant biliary dilatation identified. No large filling defects identified on these images. IMPRESSION: No significant abnormality identified on the submitted cholangiogram images. These images were submitted for radiologic interpretation only. Please see the procedural report. Electronically Signed   By: Richarda Overlie M.D.   On: 11/20/2022 08:13   CT Angio Chest/Abd/Pel for Dissection W and/or Wo Contrast  Result Date: 11/18/2022 CLINICAL DATA:  Acute aortic syndrome suspected. Chest tightness radiating to left arm with nausea. EXAM: CT ANGIOGRAPHY CHEST, ABDOMEN AND PELVIS TECHNIQUE: Non-contrast CT of the chest was initially obtained. Multidetector CT imaging through the chest, abdomen and pelvis was performed using the standard protocol during bolus administration of intravenous contrast. Multiplanar reconstructed images and MIPs were obtained and reviewed to evaluate the vascular  anatomy. RADIATION DOSE REDUCTION: This exam was performed according to the departmental dose-optimization program which includes automated exposure control, adjustment of the mA and/or kV according to patient size and/or use of iterative reconstruction technique. CONTRAST:  OMNIPAQUE IOHEXOL 350 MG/ML SOLN COMPARISON:  None Available. FINDINGS: CTA CHEST FINDINGS Cardiovascular: The heart is normal in size and there is no pericardial effusion. A few scattered coronary artery calcifications are noted. The aorta and pulmonary trunk are normal in caliber. No dissection is seen. Mediastinum/Nodes: No enlarged mediastinal, hilar, or axillary lymph nodes. Thyroid gland, trachea, and  esophagus demonstrate no significant findings. Lungs/Pleura: Mild atelectasis or scarring is present bilaterally. No effusion or pneumothorax. Musculoskeletal: Degenerative changes are present in the thoracic spine. No acute osseous abnormality is seen. Review of the MIP images confirms the above findings. CTA ABDOMEN AND PELVIS FINDINGS VASCULAR Aorta: Normal caliber aorta without aneurysm, dissection, vasculitis or significant stenosis. Celiac: Patent without evidence of aneurysm, dissection, vasculitis or significant stenosis. SMA: Patent without evidence of aneurysm, dissection, vasculitis or significant stenosis. Renals: Both renal arteries are patent without evidence of aneurysm, dissection, vasculitis, fibromuscular dysplasia or significant stenosis. IMA: Patent. Inflow: Patent without evidence of aneurysm, dissection, vasculitis or significant stenosis. Veins: No obvious venous abnormality within the limitations of this arterial phase study. Review of the MIP images confirms the above findings. NON-VASCULAR Hepatobiliary: A 1.8 cm hypodensity is noted in the right lobe of the liver, possible cyst or hemangioma. Stones are present within the gallbladder. The common bile duct is distended measuring 1.1 cm. There is a 8 mm stone in the distal common bile duct at the ampulla. Pancreas: Unremarkable. No pancreatic ductal dilatation or surrounding inflammatory changes. Spleen: Normal in size without focal abnormality. Adrenals/Urinary Tract: The adrenal glands are within normal limits. The kidneys enhance symmetrically. No renal calculus or hydronephrosis bilaterally. The bladder is unremarkable. Stomach/Bowel: Stomach is within normal limits. Appendix is surgically absent. The cecum and ileocecal junction are noted in the mid left abdomen. No evidence of bowel wall thickening, distention, or inflammatory changes. No free air or pneumatosis is seen. Lymphatic: No abdominal or pelvic lymphadenopathy. Reproductive:  The uterus is not seen.  No adnexal masses. Other: No abdominopelvic ascites. A fat containing umbilical hernia is noted. Musculoskeletal: Total hip arthroplasty changes are present on the left. Sclerosis is present at the sacroiliac joints bilaterally, compatible with sacroiliitis. Degenerative changes are noted in the lumbar spine. No acute osseous abnormality. Review of the MIP images confirms the above findings. IMPRESSION: 1. No evidence of aortic aneurysm or dissection. 2. Distended common bile duct with a 8 mm stone in the distal common bile duct at the ampulla. 3. Cholelithiasis. 4. Coronary artery calcifications. Electronically Signed   By: Thornell Sartorius M.D.   On: 11/18/2022 03:46   DG Chest 2 View  Result Date: 11/18/2022 CLINICAL DATA:  Chest tightness extending to left arm with nausea. EXAM: CHEST - 2 VIEW COMPARISON:  None Available. FINDINGS: The heart size and mediastinal contours are within normal limits. No consolidation, effusion, or pneumothorax. There is dextroscoliosis of the thoracic spine. No acute osseous abnormality is seen. IMPRESSION: No active cardiopulmonary disease. Electronically Signed   By: Thornell Sartorius M.D.   On: 11/18/2022 00:16    Microbiology: Results for orders placed or performed during the hospital encounter of 06/21/20  SARS CORONAVIRUS 2 (TAT 6-24 HRS) Nasopharyngeal Nasopharyngeal Swab     Status: None   Collection Time: 06/21/20  2:45 PM   Specimen: Nasopharyngeal Swab  Result Value Ref Range Status   SARS Coronavirus 2 NEGATIVE NEGATIVE Final    Comment: (NOTE) SARS-CoV-2 target nucleic acids are NOT DETECTED.  The SARS-CoV-2 RNA is generally detectable in upper and lower respiratory specimens during the acute phase of infection. Negative results do not preclude SARS-CoV-2 infection, do not rule out co-infections with other pathogens, and should not be used as the sole basis for treatment or other patient management decisions. Negative results must  be combined with clinical observations, patient history, and epidemiological information. The expected result is Negative.  Fact Sheet for Patients: HairSlick.no  Fact Sheet for Healthcare Providers: quierodirigir.com  This test is not yet approved or cleared by the [place] FDA and  has been authorized for detection and/or diagnosis of SARS-CoV-2 by FDA under an Emergency Use Authorization (EUA). This EUA will remain  in effect (meaning this test can be used) for the duration of the COVID-19 declaration under Se ction 564(b)(1) of the Act, 21 U.S.C. section 360bbb-3(b)(1), unless the authorization is terminated or revoked sooner.  Performed at [facility], [address], [place], [place] [address]     Labs: CBC: Recent Labs  Lab 11/17/22 2317 11/19/22 0450 11/20/22 0842 11/21/22 0813  WBC 7.6 3.7* 7.4 9.4  NEUTROABS  --   --  4.5  --   HGB 13.9 12.6 12.8 12.8  HCT 40.9 37.6 38.3 37.7  MCV 95.3 96.2 96.2 95.2  PLT 302 281 295 300   Basic Metabolic Panel: Recent Labs  Lab 11/17/22 2317 11/19/22 0450 11/20/22 0842 11/21/22 0813  NA 134* 133* 133* 136  K 3.6 3.7 3.3* 4.5  CL 100 103 103 105  CO2 26 24 24 24   GLUCOSE 141* 98 99 91  BUN 11 <5* 6 <5*  CREATININE 0.75 0.44 0.49 0.58  CALCIUM 9.4 8.6* 8.9 8.9   Liver Function Tests: Recent Labs  Lab 11/18/22 0249 11/19/22 0450 11/20/22 0842 11/21/22 0813  AST 457* 274* 71* 53*  ALT 259* 499* 288* 205*  ALKPHOS 98 105 92 80  BILITOT 1.1 1.7* 0.9 1.2*  PROT 7.2 6.2* 6.5 6.5  ALBUMIN 3.9 3.3* 3.4* 3.2*   CBG: No results for input(s): "GLUCAP" in the last 168 hours.  Discharge time spent: greater than 30 minutes.  Signed: Thad Ranger, MD [facility] 11/21/2022

## 2022-11-21 NOTE — Progress Notes (Signed)
Umbilical guaze/tegaderm saturated w serosanguineous drainage, this removed per MD request, area cleaned w sterile saline and 1/2 in steristrips applied. Reviewed d/c instructions w pt and all questions answered. She verbalized understanding. D/C via w/c w all belongings in stable condition.

## 2022-11-21 NOTE — Plan of Care (Signed)

## 2022-11-21 NOTE — Progress Notes (Signed)
1 Day Post-Op   Subjective/Chief Complaint: Comfortable this morning   Objective: Vital signs in last 24 hours: Temp:  [97.6 F (36.4 C)-99.3 F (37.4 C)] 98.3 F (36.8 C) (11/09 0552) Pulse Rate:  [61-82] 65 (11/09 0552) Resp:  [9-18] 14 (11/09 0552) BP: (123-158)/(76-93) 127/77 (11/09 0552) SpO2:  [96 %-100 %] 99 % (11/09 0552) Weight:  [105.7 kg] 105.7 kg (11/08 1046) Last BM Date : 11/17/22  Intake/Output from previous day: 11/08 0701 - 11/09 0700 In: 1961.8 [P.O.:760; I.V.:1101.8; IV Piggyback:100] Out: 1970 [Urine:1950; Blood:20] Intake/Output this shift: No intake/output data recorded.  Exam: Awake and alert comfortable Abdomen soft, dressings with mild drainage  Lab Results:  Recent Labs    11/20/22 0842 11/21/22 0813  WBC 7.4 9.4  HGB 12.8 12.8  HCT 38.3 37.7  PLT 295 300   BMET Recent Labs    11/19/22 0450 11/20/22 0842  NA 133* 133*  K 3.7 3.3*  CL 103 103  CO2 24 24  GLUCOSE 98 99  BUN <5* 6  CREATININE 0.44 0.49  CALCIUM 8.6* 8.9   PT/INR No results for input(s): "LABPROT", "INR" in the last 72 hours. ABG No results for input(s): "PHART", "HCO3" in the last 72 hours.  Invalid input(s): "PCO2", "PO2"  Studies/Results: DG ERCP  Result Date: 11/20/2022 CLINICAL DATA:  Bile duct stone based on CTA. EXAM: ERCP TECHNIQUE: Multiple spot images obtained with the fluoroscopic device and submitted for interpretation post-procedure. FLUOROSCOPY: Radiation Exposure Index (as provided by the fluoroscopic device): 46.08 mGy Kerma COMPARISON:  CTA chest abdomen pelvis 11/18/2022 FINDINGS: Retrograde cholangiogram was performed. No significant biliary dilatation identified. No large filling defects identified on these images. IMPRESSION: No significant abnormality identified on the submitted cholangiogram images. These images were submitted for radiologic interpretation only. Please see the procedural report. Electronically Signed   By: Richarda Overlie M.D.    On: 11/20/2022 08:13    Anti-infectives: Anti-infectives (From admission, onward)    Start     Dose/Rate Route Frequency Ordered Stop   11/20/22 0800  ceFAZolin (ANCEF) IVPB 2g/100 mL premix        2 g 200 mL/hr over 30 Minutes Intravenous  Once 11/19/22 1629 11/20/22 1149   11/19/22 1345  ciprofloxacin (CIPRO) IVPB 400 mg        400 mg 200 mL/hr over 60 Minutes Intravenous  Once 11/19/22 1337 11/19/22 1431       Assessment/Plan: s/p Procedure(s): LAPAROSCOPIC CHOLECYSTECTOMY (N/A)   Doing well POD 1 Ok for discharge from a surgical standpoint   LOS: 3 days    Abigail Miyamoto MD 11/21/2022

## 2022-11-22 ENCOUNTER — Encounter (HOSPITAL_COMMUNITY): Payer: Self-pay | Admitting: Gastroenterology

## 2022-11-23 LAB — SURGICAL PATHOLOGY

## 2022-12-04 ENCOUNTER — Other Ambulatory Visit (HOSPITAL_COMMUNITY): Payer: Self-pay | Admitting: Orthopedic Surgery

## 2022-12-04 DIAGNOSIS — M25552 Pain in left hip: Secondary | ICD-10-CM

## 2023-02-01 ENCOUNTER — Other Ambulatory Visit (HOSPITAL_COMMUNITY): Payer: Medicaid Other

## 2023-02-12 ENCOUNTER — Inpatient Hospital Stay (HOSPITAL_COMMUNITY): Payer: Medicaid Other

## 2023-02-12 ENCOUNTER — Encounter (HOSPITAL_COMMUNITY): Payer: Medicaid Other

## 2023-02-24 ENCOUNTER — Ambulatory Visit (HOSPITAL_COMMUNITY): Payer: Medicaid Other

## 2023-02-25 ENCOUNTER — Ambulatory Visit (HOSPITAL_COMMUNITY)
Admission: RE | Admit: 2023-02-25 | Discharge: 2023-02-25 | Disposition: A | Discharge status: Home / Self Care | Payer: Medicaid Other | Source: Ambulatory Visit | Attending: Orthopedic Surgery | Admitting: Orthopedic Surgery

## 2023-02-25 ENCOUNTER — Encounter (HOSPITAL_COMMUNITY): Payer: Self-pay

## 2023-02-25 ENCOUNTER — Encounter (HOSPITAL_COMMUNITY): Payer: Medicaid Other

## 2023-02-25 DIAGNOSIS — M25552 Pain in left hip: Secondary | ICD-10-CM | POA: Diagnosis present

## 2023-02-26 MED ORDER — TECHNETIUM TC 99M MEDRONATE IV KIT
20.0000 | PACK | Freq: Once | INTRAVENOUS | Status: AC | PRN
Start: 2023-02-26 — End: 2023-02-25
  Administered 2023-02-25: 21.2 via INTRAVENOUS

## 2023-03-27 ENCOUNTER — Ambulatory Visit: Payer: Medicaid Other

## 2023-04-10 ENCOUNTER — Other Ambulatory Visit: Payer: Self-pay

## 2023-04-10 ENCOUNTER — Ambulatory Visit: Attending: Neurological Surgery

## 2023-04-10 DIAGNOSIS — M546 Pain in thoracic spine: Secondary | ICD-10-CM | POA: Insufficient documentation

## 2023-04-10 DIAGNOSIS — M5459 Other low back pain: Secondary | ICD-10-CM | POA: Insufficient documentation

## 2023-04-10 DIAGNOSIS — M4126 Other idiopathic scoliosis, lumbar region: Secondary | ICD-10-CM | POA: Diagnosis present

## 2023-04-10 NOTE — Therapy (Signed)
 OUTPATIENT PHYSICAL THERAPY THORACOLUMBAR EVALUATION   Patient Name: Lindsey Sanchez MRN: [id number] DOB:05/05/1970, 53 y.o., female Today's Date: 04/10/2023  END OF SESSION:  PT End of Session - 04/10/23 1220     Visit Number 1    Number of Visits 9    Date for PT Re-Evaluation 06/05/23    Authorization Type [employer]    PT Start Time 1040    PT Stop Time 1130    PT Time Calculation (min) 50 min    Activity Tolerance Patient tolerated treatment well    Behavior During Therapy WFL for tasks assessed/performed             Past Medical History:  Diagnosis Date   Medical history non-contributory    Pre-diabetes    Past Surgical History:  Procedure Laterality Date   ABDOMINAL HYSTERECTOMY     APPENDECTOMY     CHOLECYSTECTOMY N/A 11/20/2022   Procedure: LAPAROSCOPIC CHOLECYSTECTOMY;  Surgeon: Manus Rudd, MD;  Location: [facility];  Service: General;  Laterality: N/A;   ERCP N/A 11/19/2022   Procedure: ENDOSCOPIC RETROGRADE CHOLANGIOPANCREATOGRAPHY (ERCP);  Surgeon: Vida Rigger, MD;  Location: [facility];  Service: Gastroenterology;  Laterality: N/A;   REMOVAL OF STONES  11/19/2022   Procedure: REMOVAL OF sludge;  Surgeon: Vida Rigger, MD;  Location: [facility];  Service: Gastroenterology;;   Dennison Mascot  11/19/2022   Procedure: Dennison Mascot;  Surgeon: Vida Rigger, MD;  Location: [facility];  Service: Gastroenterology;;   TOTAL HIP ARTHROPLASTY Left 06/25/2020   Procedure: TOTAL HIP ARTHROPLASTY ANTERIOR APPROACH;  Surgeon: Sheral Apley, MD;  Location: [facility];  Service: Orthopedics;  Laterality: Left;   Patient Active Problem List   Diagnosis Date Noted   Transaminitis 11/19/2022   Obesity (BMI 30-39.9) 11/19/2022   Choledocholithiasis 11/18/2022   S/P total left hip arthroplasty 06/25/2020   Trichomoniasis 05/04/2017    PCP: Jackie Plum, MD   REFERRING PROVIDER: Arman Bogus, MD   REFERRING DIAG: Other idiopathic scoliosis, site  unspecified [M41.20]   Rationale for Evaluation and Treatment: Rehabilitation  THERAPY DIAG:  Other idiopathic scoliosis, lumbar region  Other low back pain  Pain in thoracic spine  ONSET DATE: 03/03/2023 date of referral   SUBJECTIVE:                                                                                                                                                                                           SUBJECTIVE STATEMENT: Patient reports to PT with upper and lower back pain. She reports that she was diagnosed with scoliosis in childhood. She is coming to PT now d/t having increased symptoms over  the past few years. She does reports that she has never been treated with bracing or physical therapy. She was given information to see a spine and scoliosis specialist, but is currently looking for another one who will accept her insurance plan. She was previously seen in our clinic following L THA in 2022. She also reports use of heel lift in shoe to help normalize her gait.  PERTINENT HISTORY:  Relevant PMHx includes L THA, Obesity   PAIN:  Are you having pain?  Yes: NPRS scale: 8/10 current, 10/10 at worst  Pain location: upper and lower back BIL  Pain description: aching  Aggravating factors: "anything strenous", laying down Relieving factors: ibuprofen   PRECAUTIONS: None  RED FLAGS: None   WEIGHT BEARING RESTRICTIONS: No  FALLS:  Has patient fallen in last 6 months? No  LIVING ENVIRONMENT: Lives with: lives with their family Lives in: House/apartment Stairs: No Has following equipment at home: None  OCCUPATION: Sitting at desk  PLOF: Independent  PATIENT GOALS: To minimize her symptoms, and lessen/stop the progression of the curvature  NEXT MD VISIT: Not scheduled at time of evaluation   OBJECTIVE:  Note: Objective measures were completed at Evaluation unless otherwise noted.  DIAGNOSTIC FINDINGS:  No relevant imaging results available in epic;  none included in referral   PATIENT SURVEYS:  LEFS 60/80 =  75% perceived functional ability   COGNITION: Overall cognitive status: Within functional limits for tasks assessed     SENSATION: Not tested   POSTURE: while standing, mild right>left shoulder elevation, left>right hip elevation.   PALPATION: Right thoracolumbar S curve, that is more notable in thoracic region.  Pain with palpation with lumbar CPA.   Increased pain with palpation and palpable muscle tension throughout with bilateral lumbar paraspinals.  LUMBAR ROM:   AROM eval  Flexion 90%  Extension 75%  Right lateral flexion 80%  Left lateral flexion 80%  Right rotation 80%  Left rotation 80%   (Blank rows = not tested)  LOWER EXTREMITY ROM:     Active  Right eval Left eval  Hip flexion    Hip extension    Hip abduction    Hip adduction    Hip internal rotation    Hip external rotation    Knee flexion    Knee extension    Ankle dorsiflexion    Ankle plantarflexion    Ankle inversion    Ankle eversion     (Blank rows = not tested)  LOWER EXTREMITY MMT:    MMT Right eval Left eval  Hip flexion    Hip extension    Hip abduction    Hip adduction    Hip internal rotation    Hip external rotation    Knee flexion    Knee extension    Ankle dorsiflexion    Ankle plantarflexion    Ankle inversion    Ankle eversion     (Blank rows = not tested)  LUMBAR SPECIAL TESTS:  Not performed at time of evaluation   FUNCTIONAL TESTS:  Not performed at time of evaluation   Leg Length Discrepancy Test: Negative  GAIT: Distance walked: 50 feet from lobby to evaluation area  Assistive device utilized: None Level of assistance: Complete Independence Comments: Left stance time is diminished as compared to Right stance phase, Right swing phase is diminished as compared to Left  TREATMENT DATE:   [facility] Adult PT Treatment:  DATE: 04/10/2023   Initial  evaluation: see patient education and home exercise program as noted below   Neuromuscular Re-education: verbal and visual cueing required for instruction and to improve muscle recruitment   Cat Cow x 10 Modified Dead Bug from hooklying position 2 x 5  BIL Shoulder extension GTB, 2 x 10  RIGHT Shoulder Adduction with Anchored Resistance  x 10  LEFT Standing Sidebends  x 10 - consider adding 5# dumbbell at next visit                                                                  PATIENT EDUCATION:  Education details: reviewed initial home exercise program; discussion of POC, prognosis and goals for skilled PT as related to referring diagnosis and current presentation  Person educated: Patient Education method: Explanation, Demonstration, and Handouts Education comprehension: verbalized understanding, returned demonstration, and needs further education  HOME EXERCISE PROGRAM: Access Code: [email or web address] URL: [email or web address] Date: 04/10/2023 Prepared by: Mauri Reading  Exercises - Cat Cow to Child's Pose  - 1 x daily - 7 x weekly - 2-3 sets - 10 reps - 3 sec hold - Dead Bug  - 1 x daily - 7 x weekly - 2 sets - 10 reps - Shoulder extension with resistance - Neutral  - 1 x daily - 7 x weekly - 2 sets - 10 reps - Shoulder Adduction with Anchored Resistance  - 1 x daily - 7 x weekly - 2 sets - 10 reps - Standing Sidebends  - 1 x daily - 7 x weekly - 2 sets - 10 reps  ASSESSMENT:  CLINICAL IMPRESSION: Angeliz is a 53 y.o. female  who was seen today for physical therapy evaluation and treatment for Persistent Low Back Pain with hx of scoliosis. Her curvature appears to be Right thoracolumbar S curve, that is more notable in thoracic region. Her LS AROM is grossly WFL. However, she is demonstrating mild right>left shoulder elevation, and left>right hip elevation while standing. She additionally has non-radiating pain with palpation throughout lumbar spine and paraspinals. Her  leg length discrepancy test is negative. She responded well to initial intervention today and reports decreased pain severity from 8/10 to 6/10. She verbalizes willingness to participate in PT, and adhere to prescribed HEP. She has related pain and difficulty with heavy lifting, strenuous activities and laying down. She requires skilled PT services at this time to address relevant deficits and improve overall function.     OBJECTIVE IMPAIRMENTS: decreased activity tolerance, postural dysfunction, and pain.   ACTIVITY LIMITATIONS: carrying, lifting, bending, standing, and sleeping  PARTICIPATION LIMITATIONS: meal prep, cleaning, and community activity  PERSONAL FACTORS: Time since onset of injury/illness/exacerbation and 1 comorbidity: Relevant PMHx includes L THA, Obesity   are also affecting patient's functional outcome.   REHAB POTENTIAL: Fair    CLINICAL DECISION MAKING: Stable/uncomplicated  EVALUATION COMPLEXITY: Low   GOALS: Goals reviewed with patient? Yes  SHORT TERM GOALS: Target date: 05/08/2023   Patient will be independent with initial home program for postural reeducation and core stabilization, at least 3 days/week.  Baseline: provided at eval  Goal status: INITIAL   LONG TERM GOALS: Target date: 06/05/2023   Patient will report improved overall functional ability with LEFS score of 70/80 or greater  Baseline: 60/80 Goal status: INITIAL  2.  Patient will report no more than 6/10 pain with ADLs/IADLs.  Baseline: 10/10 worst pain.  Goal status: INITIAL  3.  Patient will demonstrate ability to perform floor to waist lifting of at least 20# using appropriate body mechanics and with no more than minimal pain in order to safely perform normal daily/occupational tasks.   Baseline: unable to tolerate heavy lifting greater than 8-10lbs  Goal status: INITIAL  4.  Patient will demonstrate good postural endurance with functional and reciprocal movements, including bird-dog  exercise and standing chops.  Baseline: began modified dead bugs at eval, requires cueing  Goal status: INITIAL   PLAN:  PT FREQUENCY: 1x/week  PT DURATION: 8 weeks  PLANNED INTERVENTIONS: 97164- PT Re-evaluation, 97110-Therapeutic exercises, 97530- Therapeutic activity, [id number]- Neuromuscular re-education, 97535- Self Care, 16109- Manual therapy, [id number]- Electrical stimulation (unattended), Patient/Family education, Taping, Dry Needling, Spinal mobilization, Cryotherapy, and Moist heat.  For all possible CPT codes, reference the Planned Interventions line above.     Check all conditions that are expected to impact treatment: {Conditions expected to impact treatment:Structural or anatomic abnormalities   If treatment provided at initial evaluation, no treatment charged due to lack of authorization.       PLAN FOR NEXT SESSION: assess UE/LE MMT; postural reed and strengthening, pilates reformer activities, core stabilization progression with UE/LE movement to improve tolerance of functional movements, modalities as indicated from pain modulation    Mauri Reading, PT, DPT  04/10/2023 1:08 PM

## 2023-04-24 ENCOUNTER — Ambulatory Visit

## 2023-05-26 ENCOUNTER — Other Ambulatory Visit: Payer: Self-pay | Admitting: Internal Medicine

## 2023-05-26 DIAGNOSIS — Z1231 Encounter for screening mammogram for malignant neoplasm of breast: Secondary | ICD-10-CM

## 2023-06-02 ENCOUNTER — Ambulatory Visit
Admission: RE | Admit: 2023-06-02 | Discharge: 2023-06-02 | Disposition: A | Discharge status: Home / Self Care | Source: Ambulatory Visit | Attending: Internal Medicine | Admitting: Internal Medicine

## 2023-06-02 DIAGNOSIS — Z1231 Encounter for screening mammogram for malignant neoplasm of breast: Secondary | ICD-10-CM
